# Patient Record
Sex: Female | Born: 1980 | Race: White | Hispanic: No | Marital: Married | Smoking: Former smoker
Health system: Southern US, Community
[De-identification: ages and names within clinical notes are randomized; demographics above are authoritative.]

## PROBLEM LIST (undated history)

## (undated) DIAGNOSIS — I10 Essential (primary) hypertension: Secondary | ICD-10-CM

## (undated) DIAGNOSIS — D649 Anemia, unspecified: Secondary | ICD-10-CM

## (undated) DIAGNOSIS — K219 Gastro-esophageal reflux disease without esophagitis: Secondary | ICD-10-CM

## (undated) DIAGNOSIS — R51 Headache: Secondary | ICD-10-CM

## (undated) DIAGNOSIS — J189 Pneumonia, unspecified organism: Secondary | ICD-10-CM

## (undated) DIAGNOSIS — R519 Headache, unspecified: Secondary | ICD-10-CM

## (undated) HISTORY — DX: Essential (primary) hypertension: I10

## (undated) HISTORY — PX: WISDOM TOOTH EXTRACTION: SHX21

---

## 2014-07-03 ENCOUNTER — Other Ambulatory Visit (INDEPENDENT_AMBULATORY_CARE_PROVIDER_SITE_OTHER): Payer: Self-pay | Admitting: General Surgery

## 2014-07-03 DIAGNOSIS — Z6841 Body Mass Index (BMI) 40.0 and over, adult: Secondary | ICD-10-CM

## 2014-07-03 DIAGNOSIS — I10 Essential (primary) hypertension: Secondary | ICD-10-CM

## 2014-07-03 DIAGNOSIS — K219 Gastro-esophageal reflux disease without esophagitis: Secondary | ICD-10-CM

## 2014-07-15 ENCOUNTER — Other Ambulatory Visit: Payer: Self-pay | Admitting: Family Medicine

## 2014-07-15 ENCOUNTER — Other Ambulatory Visit (HOSPITAL_COMMUNITY)
Admission: RE | Admit: 2014-07-15 | Discharge: 2014-07-15 | Disposition: A | Discharge status: Home / Self Care | Payer: Managed Care, Other (non HMO) | Source: Ambulatory Visit | Attending: Family Medicine | Admitting: Family Medicine

## 2014-07-15 DIAGNOSIS — Z01419 Encounter for gynecological examination (general) (routine) without abnormal findings: Secondary | ICD-10-CM | POA: Diagnosis present

## 2014-07-15 DIAGNOSIS — Z1151 Encounter for screening for human papillomavirus (HPV): Secondary | ICD-10-CM | POA: Diagnosis present

## 2014-07-18 LAB — CYTOLOGY - PAP

## 2014-07-25 ENCOUNTER — Ambulatory Visit (HOSPITAL_COMMUNITY)
Admission: RE | Admit: 2014-07-25 | Discharge: 2014-07-25 | Disposition: A | Discharge status: Home / Self Care | Payer: Managed Care, Other (non HMO) | Source: Ambulatory Visit | Attending: General Surgery | Admitting: General Surgery

## 2014-07-25 ENCOUNTER — Other Ambulatory Visit: Payer: Self-pay

## 2014-07-25 DIAGNOSIS — K219 Gastro-esophageal reflux disease without esophagitis: Secondary | ICD-10-CM | POA: Diagnosis not present

## 2014-07-25 DIAGNOSIS — Z01818 Encounter for other preprocedural examination: Secondary | ICD-10-CM | POA: Insufficient documentation

## 2014-08-04 ENCOUNTER — Encounter: Payer: Self-pay | Admitting: Dietician

## 2014-08-04 ENCOUNTER — Encounter: Payer: Managed Care, Other (non HMO) | Attending: General Surgery | Admitting: Dietician

## 2014-08-04 DIAGNOSIS — Z6841 Body Mass Index (BMI) 40.0 and over, adult: Secondary | ICD-10-CM | POA: Diagnosis not present

## 2014-08-04 DIAGNOSIS — Z713 Dietary counseling and surveillance: Secondary | ICD-10-CM | POA: Insufficient documentation

## 2014-08-04 NOTE — Patient Instructions (Signed)
Follow Pre-Op Goals Try Protein Shakes Return for 3 additional supervised weight loss visit [facility] at [phone] when surgery is scheduled to enroll in Pre-Op Class  Things to remember:  Please always be honest with us. We want to support you!  If you have any questions or concerns in between appointments, please call or email Jennette BankerLiz, Leslie, or Jacki ConesLaurie.  The diet after surgery will be high protein and low in carbohydrate.  Vitamins and calcium need to be taken for the rest of your life.  Feel free to include support people in any classes or appointments.

## 2014-08-04 NOTE — Progress Notes (Signed)
  Pre-Op Assessment Visit:  Pre-Operative RYGB Surgery  Medical Nutrition Therapy:  Appt start time: 0915  End time:  0945.  Patient was seen on 08/04/2014 for Pre-Operative Nutrition Assessment. Assessment and [facility] coordinator once plan for treating binge eating is established.   Preferred Learning Style:   No preference indicated   Learning Readiness:   Ready  Handouts given during visit include:  Pre-Op Goals Bariatric Surgery Protein Shakes   During the appointment today the following Pre-Op Goals were reviewed with the patient: Maintain or lose weight as instructed by your surgeon Make healthy food choices Begin to limit portion sizes Limited concentrated sugars and fried foods Keep fat/sugar in the single digits per serving on   food labels Practice CHEWING your food  (aim for 30 chews per bite or until applesauce consistency) Practice not drinking 15 minutes before, during, and 30 minutes after each meal/snack Avoid all carbonated beverages  Avoid/limit caffeinated beverages  Avoid all sugar-sweetened beverages Consume 3 meals per day; eat every 3-5 hours Make a list of non-food related activities Aim for 64-100 ounces of FLUID daily  Aim for at least 60-80 grams of PROTEIN daily Look for a liquid protein source that contain ?15 g protein and ?5 g carbohydrate  (ex: shakes, drinks, shots)  Patient-Centered Goals: Kylie Mitchell would like get healthier and decrease blood pressure. She would also like to enjoy her life more. She is very confident that she will be able to follow the pre op goals and thinks they are very important to follow.   Demonstrated degree of understanding via:  Teach Back  Teaching Method Utilized:  Visual Auditory Hands on  Barriers to learning/adherence to lifestyle change: depression  [facility] to [facility] the [facility] to enroll in Pre-Op and  Post-Op Nutrition Education when surgery date is scheduled.

## 2014-08-25 ENCOUNTER — Encounter: Payer: Managed Care, Other (non HMO) | Attending: General Surgery | Admitting: Dietician

## 2014-08-25 DIAGNOSIS — Z713 Dietary counseling and surveillance: Secondary | ICD-10-CM | POA: Diagnosis not present

## 2014-08-25 DIAGNOSIS — Z6841 Body Mass Index (BMI) 40.0 and over, adult: Secondary | ICD-10-CM | POA: Insufficient documentation

## 2014-08-25 NOTE — Patient Instructions (Signed)
Continue working on C.H. Robinson WorldwidePre-Op goals. (chewing) Increase walking to 30 minutes per day. Continue following up with Dr. Cyndia SkeetersLurey.

## 2014-08-25 NOTE — Progress Notes (Signed)
3 Months Supervised Weight Loss Visit:   Pre-Operative RYGB Surgery  Medical Nutrition Therapy:  Appt start time: 1130 end time:  1145.  Primary concerns today: Supervised Weight Loss Visit. Has seen Dr. Cyndia SkeetersLurey since previous visit and will be seeing him several more times. Still having no sugar, no soda and no bread and having Premier Protein shakes. Thinking about having the LAGB instead of the RYGB since she would like to not have a permanent procedure. Lost 1 lbs since last visit and only drinking water and crystal light. Working on portion control and chewing foods well. Walking about 20 minutes per day and would like to increase it to 30 minutes per day. Also would like to start swimming when weather gets warmer.  Wt Readings from Last 3 Encounters:  08/25/14 458 lb (207.747 kg)  08/04/14 459 lb 12.8 oz (208.564 kg)   Ht Readings from Last 3 Encounters:  08/25/14 5' 7.5" (1.715 m)  08/04/14 5' 7.5" (1.715 m)   Body mass index is 70.63 kg/(m^2). @BMIFA @ Normalized weight-for-age data available only for age 72 to 20 years. Normalized stature-for-age data available only for age 72 to 20 years.   Patient-Centered Goals: Kylie Mitchell would like get healthier and decrease blood pressure. She would also like to enjoy her life more. She is very confident that she will be able to follow the pre op goals and thinks they are very important to follow.   Preferred Learning Style:   No preference indicated   Learning Readiness:   Ready  Recent physical activity:  Walking 20 minutes each day  Progress Towards Goal(s):  In progress.  Nutritional Diagnosis:  [name]-3.3 Obesity related to past poor dietary habits and physical inactivity as evidenced by patient attending supervised weight loss for insurance approval of bariatric surgery.    Intervention:  Nutrition counseling provided. Plan: Continue working on C.H. Robinson WorldwidePre-Op goals. (chewing) Increase walking to 30 minutes per day. Continue following up  with Dr. Cyndia SkeetersLurey.  Teaching Method Utilized:  Visual Auditory Hands on  Barriers to learning/adherence to lifestyle change: emotional eating  Demonstrated degree of understanding via:  Teach Back   Monitoring/Evaluation:  Dietary intake, exercise, and body weight. Follow up in 1 months for 3 month supervised weight loss visit.

## 2014-09-08 ENCOUNTER — Other Ambulatory Visit: Payer: Self-pay | Admitting: Obstetrics and Gynecology

## 2014-09-08 DIAGNOSIS — N921 Excessive and frequent menstruation with irregular cycle: Secondary | ICD-10-CM

## 2014-09-15 ENCOUNTER — Ambulatory Visit
Admission: RE | Admit: 2014-09-15 | Discharge: 2014-09-15 | Disposition: A | Discharge status: Home / Self Care | Payer: Managed Care, Other (non HMO) | Source: Ambulatory Visit | Attending: Obstetrics and Gynecology | Admitting: Obstetrics and Gynecology

## 2014-09-15 ENCOUNTER — Other Ambulatory Visit: Payer: Managed Care, Other (non HMO)

## 2014-09-15 DIAGNOSIS — N921 Excessive and frequent menstruation with irregular cycle: Secondary | ICD-10-CM

## 2014-09-22 ENCOUNTER — Encounter: Payer: Managed Care, Other (non HMO) | Attending: General Surgery | Admitting: Dietician

## 2014-09-22 DIAGNOSIS — Z6841 Body Mass Index (BMI) 40.0 and over, adult: Secondary | ICD-10-CM | POA: Insufficient documentation

## 2014-09-22 DIAGNOSIS — Z713 Dietary counseling and surveillance: Secondary | ICD-10-CM | POA: Diagnosis not present

## 2014-09-22 NOTE — Progress Notes (Signed)
3 Months Supervised Weight Loss Visit:   Pre-Operative RYGB Surgery  Medical Nutrition Therapy:  Appt start time: 915 end time:  930.  Primary concerns today: Supervised Weight Loss Visit. Returns with a 3 lb weight loss. Increased walking to 30 minutes per day. Feeling really good already.   Wt Readings from Last 3 Encounters:  09/22/14 455 lb 1.6 oz (206.432 kg)  08/25/14 458 lb (207.747 kg)  08/04/14 459 lb 12.8 oz (208.564 kg)   Ht Readings from Last 3 Encounters:  09/22/14 5' 7.5" (1.715 m)  08/25/14 5' 7.5" (1.715 m)  08/04/14 5' 7.5" (1.715 m)   Body mass index is 70.19 kg/(m^2). @BMIFA @ Normalized weight-for-age data available only for age 69 to 20 years. Normalized stature-for-age data available only for age 69 to 20 years.    Patient-Centered Goals: Kylie Mitchell would like get healthier and decrease blood pressure. She would also like to enjoy her life more. She is very confident that she will be able to follow the pre op goals and thinks they are very important to follow.   Preferred Learning Style:   No preference indicated   Learning Readiness:   Ready  Recent physical activity:  Walking 30 minutes each day  Progress Towards Goal(s):  In progress.  Nutritional Diagnosis:  Kylie Mitchell-3.3 Obesity related to past poor dietary habits and physical inactivity as evidenced by patient attending supervised weight loss for insurance approval of bariatric surgery.    Intervention:  Nutrition counseling provided. Plan: Continue working on C.H. Robinson WorldwidePre-Op goals. (chewing) Start getting into more of routine.  Continue walking to 30 minutes per day. Look into swimming.  Continue following up with Dr. Cyndia SkeetersLurey.  Teaching Method Utilized:  Visual Auditory Hands on  Barriers to learning/adherence to lifestyle change: emotional eating  Demonstrated degree of understanding via:  Teach Back   Monitoring/Evaluation:  Dietary intake, exercise, and body weight. Follow up in 1 months for 3 month  supervised weight loss visit.

## 2014-09-22 NOTE — Patient Instructions (Signed)
Continue working on C.H. Robinson WorldwidePre-Op goals. (chewing) Start getting into more of routine.  Continue walking to 30 minutes per day. Look into swimming.  Continue following up with Dr. Cyndia SkeetersLurey.

## 2014-09-30 ENCOUNTER — Ambulatory Visit (INDEPENDENT_AMBULATORY_CARE_PROVIDER_SITE_OTHER): Payer: Managed Care, Other (non HMO) | Admitting: Psychiatry

## 2014-10-16 ENCOUNTER — Ambulatory Visit (INDEPENDENT_AMBULATORY_CARE_PROVIDER_SITE_OTHER): Payer: Managed Care, Other (non HMO) | Admitting: Psychiatry

## 2014-10-20 ENCOUNTER — Encounter: Payer: Self-pay | Admitting: Dietician

## 2014-10-20 ENCOUNTER — Encounter: Payer: Managed Care, Other (non HMO) | Attending: General Surgery | Admitting: Dietician

## 2014-10-20 DIAGNOSIS — Z713 Dietary counseling and surveillance: Secondary | ICD-10-CM | POA: Insufficient documentation

## 2014-10-20 DIAGNOSIS — Z6841 Body Mass Index (BMI) 40.0 and over, adult: Secondary | ICD-10-CM | POA: Diagnosis not present

## 2014-10-20 NOTE — Progress Notes (Signed)
3 Months Supervised Weight Loss Visit:   Pre-Operative RYGB Surgery  Medical Nutrition Therapy:  Appt start time: 935 end time:  950.  Primary concerns today: Supervised Weight Loss Visit #3. Returns with a 7 lb weight gain. Started taking provera this past month. Has had some issues with acid reflux. Increased walking to 30 minutes per day. Has been sticking with the diet to follow for the most part. Has a sweet tooth and trying to figure out what to have. Having crystal light lemonade.   Wt Readings from Last 3 Encounters:  10/20/14 462 lb 8 oz (209.789 kg)  09/22/14 455 lb 1.6 oz (206.432 kg)  08/25/14 458 lb (207.747 kg)   Ht Readings from Last 3 Encounters:  10/20/14 5' 7.5" (1.715 m)  09/22/14 5' 7.5" (1.715 m)  08/25/14 5' 7.5" (1.715 m)   Body mass index is 71.33 kg/(m^2). @BMIFA @ Normalized weight-for-age data available only for age 69 to 20 years. Normalized stature-for-age data available only for age 69 to 20 years.   Patient-Centered Goals: Jennette KettleDeanna would like get healthier and decrease blood pressure. She would also like to enjoy her life more. She is very confident that she will be able to follow the pre op goals and thinks they are very important to follow.   Preferred Learning Style:   No preference indicated   Learning Readiness:   Ready  Recent physical activity:  Walking 30 minutes most days  Progress Towards Goal(s):  In progress.  Nutritional Diagnosis:  [place]-3.3 Obesity related to past poor dietary habits and physical inactivity as evidenced by patient attending supervised weight loss for insurance approval of bariatric surgery.    Intervention:  Nutrition counseling provided. Plan: Continue working on C.H. Robinson WorldwidePre-Op goals. (chewing and eating slower) Continue getting into more of routine - regular meal times.  Continue walking to 30 minutes per day. [facility] 2-3 x week.  Continue following up with Dr. Noe GensPeters.  Have fruit if there are  cookies at the office or bring your own snacks instead of going to the vending machine. Try using a smaller plate to help with portion sizes. Try to eat meals without distractions.  [facility] at [phone] when surgery is scheduled to enroll in Pre-Op Class  Teaching Method Utilized:  Visual Auditory Hands on  Barriers to learning/adherence to lifestyle change: emotional eating  Demonstrated degree of understanding via:  Teach Back   Monitoring/Evaluation:  Dietary intake, exercise, and body weight. Follow up to attend Pre-Op Class

## 2014-10-20 NOTE — Patient Instructions (Addendum)
Continue working on C.H. Robinson WorldwidePre-Op goals. (chewing and eating slower) Continue getting into more of routine - regular meal times.  Continue walking to 30 minutes per day. [facility] 2-3 x week.  Continue following up with Dr. Noe GensPeters.  Have fruit if there are cookies at the office or bring your own snacks instead of going to the vending machine. Try using a smaller plate to help with portion sizes. Try to eat meals without distractions.  [facility] at [phone] when surgery is scheduled to enroll in Pre-Op Class

## 2014-10-28 ENCOUNTER — Other Ambulatory Visit: Payer: Self-pay | Admitting: General Surgery

## 2014-11-12 ENCOUNTER — Other Ambulatory Visit: Payer: Self-pay | Admitting: General Surgery

## 2014-12-04 ENCOUNTER — Ambulatory Visit
Admission: RE | Admit: 2014-12-04 | Discharge: 2014-12-04 | Disposition: A | Discharge status: Home / Self Care | Payer: Managed Care, Other (non HMO) | Source: Ambulatory Visit | Attending: General Surgery | Admitting: General Surgery

## 2015-01-19 ENCOUNTER — Encounter: Payer: Managed Care, Other (non HMO) | Attending: General Surgery

## 2015-01-19 DIAGNOSIS — Z713 Dietary counseling and surveillance: Secondary | ICD-10-CM | POA: Diagnosis not present

## 2015-01-19 DIAGNOSIS — Z6841 Body Mass Index (BMI) 40.0 and over, adult: Secondary | ICD-10-CM | POA: Diagnosis not present

## 2015-01-19 NOTE — Progress Notes (Signed)
  Pre-Operative Nutrition Class:  Appt start time: 830   End time:  930.  Patient was seen on 01/19/15 for Pre-Operative Bariatric Surgery Education at [facility].   Surgery date:  Surgery type: RYGB [facility] at [facility]: 459.5 lbs on 08/04/14 Weight today: 454 lbs  TANITA  BODY COMP RESULTS  01/19/15   BMI (kg/m^2) 70.1   Fat Mass (lbs) 250   Fat Free Mass (lbs) 204   Total Body Water (lbs) 149.5   Samples given per MNT protocol. Patient educated on appropriate usage: Premier protein shake (vanilla - qty 1) Lot #: [id number] Exp: 06/2015  Celebrate calcium chew (berry - qty 1) Lot #: [id number] Exp: 08/2016  Renee Pain Protein Powder (unflavored - qty 1) Lot #: [id number] Exp: 12/2015  The following the learning objectives were met by the patient during this course:  Identify Pre-Op Dietary Goals and will begin 2 weeks pre-operatively  Identify appropriate sources of fluids and proteins   State protein recommendations and appropriate sources pre and post-operatively  Identify Post-Operative Dietary Goals and will follow for 2 weeks post-operatively  Identify appropriate multivitamin and calcium sources  Describe the need for physical activity post-operatively and will follow MD recommendations  State when to call healthcare provider regarding medication questions or post-operative complications  Handouts given during class include:  Pre-Op Bariatric Surgery Diet Handout  Protein Shake Handout  Post-Op Bariatric Surgery Nutrition Handout  BELT Program Information Flyer  Support Group Information Flyer  [facility] Bariatric Supplements Price List  Follow-Up Plan: Patient will follow-up at [facility] 2 weeks post operatively for diet advancement per MD.

## 2015-01-29 NOTE — Progress Notes (Signed)
Cardiology Office Note   Date:  01/30/2015   ID:  Kylie Mitchell, DOB 06-10-1980, MRN [id number]  PCP:  Jarrett Soho, PA-C  Cardiologist:   Madilyn Hook, MD   Chief Complaint  Patient presents with  . Advice Only    NP- SURGICAL CLEARANCE  bariatric surgery scheduled 03/02/15      History of Present Illness: Kylie Mitchell is a 34 y.o. female with hypertension and  morbid obesity who presents for presurgical evaluation. Kylie Mitchell recently joined a gym and has been exercising at the pool almost daily for the last 5 weeks.  She swims for 1-2 hours and participates in exercise classes.  Since doing this she has noticed increased energy, is feeling better and sleeping better.  She denies any chest pain or pressure with these activities.  She has lost 26lb since January.  She report occasional dizziness when she first starts exercising.  This goes away within seconds and she has not passed out.  She also notices lightheadedness when going from sitting to standing.  Kylie Mitchell endorses LE edema x 6 years that improves with elevation.  She denies orthopnea.  She has exertional dyspnea that she associates with being deconditioned and overweight. However she is able to walk up a flight of stairs or walk several blocks without stopping.  She does not get any CP with these activities.  She does all her ADLs independently.   Kylie Mitchell is on her weight loss journey with her husband, who is scheduled for bariatric surgery on Monday.   Past Medical History  Diagnosis Date  . Hypertension     Past Surgical History  Procedure Laterality Date  . Cesarean section       Current Outpatient Prescriptions  Medication Sig Dispense Refill  . hydrochlorothiazide (HYDRODIURIL) 25 MG tablet Take 25 mg by mouth daily.    Marland Kitchen losartan (COZAAR) 50 MG tablet Take 50 mg by mouth daily.  1  . medroxyPROGESTERone (PROVERA) 10 MG tablet Take 10 mg by mouth daily.    .  ranitidine (ZANTAC) 150 MG capsule Take 150 mg by mouth 2 (two) times daily.     No current facility-administered medications for this visit.    Allergies:   Morphine and related and Penicillins    Social History:  The patient  reports that she has quit smoking. Her smoking use included Cigarettes. She has a 30 pack-year smoking history. She has never used smokeless tobacco. She reports that she drinks alcohol.   Family History:  The patient's family history includes Hypertension in her maternal aunt, maternal uncle, and mother.    ROS:  Please see the history of present illness.   Otherwise, review of systems are positive for none.   All other systems are reviewed and negative.    PHYSICAL EXAM: VS:  BP 128/92 mmHg  Pulse 79  Ht 5\' 8"  (1.727 m)  Wt 203.438 kg (448 lb 8 oz)  BMI 68.21 kg/m2 , BMI Body mass index is 68.21 kg/(m^2). GENERAL:  Well appearing.  Morbidly obese. HEENT:  Pupils equal round and reactive, fundi not visualized, oral mucosa unremarkable NECK:  No jugular venous distention, waveform within normal limits, carotid upstroke brisk and symmetric, no bruits, no thyromegaly LYMPHATICS:  No cervical adenopathy LUNGS:  Clear to auscultation bilaterally HEART:  RRR.  PMI not displaced or sustained,S1 and S2 within normal limits, no S3, no S4, no clicks, no rubs, no murmurs ABD:  Flat, positive bowel sounds  normal in frequency in pitch, no bruits, no rebound, no guarding, no midline pulsatile mass, no hepatomegaly, no splenomegaly EXT:  2 plus pulses throughout, no edema, no cyanosis no clubbing SKIN:  No rashes no nodules NEURO:  Cranial nerves II through XII grossly intact, motor grossly intact throughout PSYCH:  Cognitively intact, oriented to person place and time    EKG:  EKG is ordered today. The ekg ordered today demonstrates sinus rhythm 79 bpm.  Low voltage precordial leads.   Recent Labs: No results found for requested labs within last 365 days.     Lipid Panel No results found for: CHOL, TRIG, HDL, CHOLHDL, VLDL, LDLCALC, LDLDIRECT    Wt Readings from Last 3 Encounters:  01/30/15 203.438 kg (448 lb 8 oz)  01/19/15 205.933 kg (454 lb)  10/20/14 209.789 kg (462 lb 8 oz)      Other studies Reviewed: Additional studies/ records that were reviewed today include: nutritionist notes. Review of the above records demonstrates:  Please see elsewhere in the note.     ASSESSMENT AND PLAN:  # Pre-surgical assessment:  Kylie Mitchell is able to perform her ADLs and more than 4 METs of activity without chest pain.  Although she is morbidly obese, he is quite young, making a cardiac event less likely.  The patient does not have any unstable cardiac conditions.  Upon evaluation today, she can achieve 4 METs or greater without anginal symptoms.  According to [facility] and AHA guidelines, she requires no further cardiac workup prior to her noncardiac surgery and should be at acceptable risk.  Our service is available as necessary in the perioperative period.  We will obtain a BMP today so that we can perform a formal NSQIP risk assessment profile. - BMP to complete surgical risk assessment - If creatinine is <1.5, will proceed with surgery without additional testing.  # Hypertension: BP nearly at goal.  No changes to current regimen of losartan and HCTZ.   Current medicines are reviewed at length with the patient today.  The patient does not have concerns regarding medicines.  The following changes have been made:  no change  Labs/ tests ordered today include: BMP  Orders Placed This Encounter  Procedures  . Basic metabolic panel  . EKG 12-Lead     Disposition:   FU with Dr. Elmarie Shiley C. Duke Salvia prn   Signed, Madilyn Hook, MD  01/30/2015 8:35 AM    [facility]

## 2015-01-30 ENCOUNTER — Encounter: Payer: Self-pay | Admitting: Cardiovascular Disease

## 2015-01-30 ENCOUNTER — Ambulatory Visit (INDEPENDENT_AMBULATORY_CARE_PROVIDER_SITE_OTHER): Payer: Managed Care, Other (non HMO) | Admitting: Cardiovascular Disease

## 2015-01-30 VITALS — BP 128/92 | HR 79 | Ht 68.0 in | Wt >= 6400 oz

## 2015-01-30 DIAGNOSIS — Z01810 Encounter for preprocedural cardiovascular examination: Secondary | ICD-10-CM

## 2015-01-30 LAB — BASIC METABOLIC PANEL
BUN: 11 mg/dL (ref 7–25)
CO2: 31 mmol/L (ref 20–31)
Calcium: 9.4 mg/dL (ref 8.6–10.2)
Chloride: 100 mmol/L (ref 98–110)
Creat: 0.7 mg/dL (ref 0.50–1.10)
GLUCOSE: 97 mg/dL (ref 65–99)
Potassium: 3.8 mmol/L (ref 3.5–5.3)
SODIUM: 139 mmol/L (ref 135–146)

## 2015-01-30 NOTE — Patient Instructions (Signed)
Your physician recommends that you return for lab work today. If your results are within normal limits you will be given clearance to have your surgery.  Your physician recommends that you schedule a follow-up appointment as needed.

## 2015-02-02 ENCOUNTER — Telehealth: Payer: Self-pay | Admitting: *Deleted

## 2015-02-02 NOTE — Telephone Encounter (Signed)
-----   Message from Chilton Si, MD sent at 02/02/2015  1:24 PM EDT ----- Labs are normal.  Ms. Kylie Mitchell is OK to go forward with surgery without further testing.

## 2015-02-02 NOTE — Telephone Encounter (Signed)
LEFT DETAIL MESSAGE SECURE VOICE MAIL. ROUTED INFORMATION TO Dr Minerva Areola Andrey Campanile

## 2015-02-11 ENCOUNTER — Ambulatory Visit: Payer: Self-pay | Admitting: General Surgery

## 2015-02-11 NOTE — H&P (Signed)
Kylie Mitchell 02/11/2015 11:36 AM Location: [facility] Patient #: [id number] DOB: May 15, 1981 Married / Language: Kylie Mitchell / Race: White Female History of Present Illness Randall Hiss M. Alyana Kreiter MD; 02/11/2015 12:19 PM) Patient words: bariatric.  The patient is a 34 year old female who presents for a preoperative evaluation. She has been approved for laparoscopic Roux-en-Y gastric bypass. I initially met her on January 28. Her weight at that time was 473 pounds. She denies any medical changes since she was initially seen. She did quit smoking in March. She recently started swimming on a regular basis and has lost about 35 pounds. She is swimming 5 times a week as well as doing water aerobics. She did see a cardiologist for clearance and was cleared for surgery. She denies any abdominal pain, nausea, vomiting, chest pain, chest pressure, shortness of breath. She states her knee pain has improved since she has been exercising.  Her upper GI was within normal limits. Her chest x-ray and EKG were normal. Her initial evaluation labs were unremarkable except for hemoglobin A1c of 6.6, HDL level 41 LDL level 128, iron at 37  a 12 point comprehensive ROS was performed and negative except for HPI Problem List/Past Medical Gayland Curry, MD; 02/11/2015 12:18 PM) GASTROESOPHAGEAL REFLUX DISEASE, ESOPHAGITIS PRESENCE NOT SPECIFIED (K21.9) ESSENTIAL HYPERTENSION (I10) CHRONIC PAIN OF BOTH KNEES (M25.561, M25.562) MORBID OBESITY WITH BMI OF 60.0-69.9, ADULT (E66.01) PREDIABETES (R73.09)  Other Problems Gayland Curry, MD; 02/11/2015 12:18 PM) Migraine Headache Anxiety Disorder Back Pain Depression  Past Surgical History Gayland Curry, MD; 02/11/2015 12:18 PM) Cesarean Section - 1  Diagnostic Studies History Gayland Curry, MD; 02/11/2015 12:18 PM) Colonoscopy never Mammogram never Pap Smear >5 years ago  Allergies ([place], [place]; 02/11/2015 11:36  AM) Penicillins Morphine Derivatives No Known Drug Allergies 07/03/2014 (Marked as Inactive)  Medication History Gayland Curry, MD; 02/11/2015 12:18 PM) Hydrochlorothiazide (25MG Tablet, Oral daily) Active. Losartan Potassium (50MG Tablet, Oral) Active. Ranitidine HCl (150MG Capsule, Oral) Active. Medications Reconciled Zofran ODT (4MG Tablet Disperse, 1 (one) Tablet Disperse Oral every six hours, as needed, Taken starting 02/11/2015) Active. OxyCODONE HCl (5MG/5ML Solution, 5-10 Milliliter Oral every four hours, as needed, Taken starting 02/11/2015) Active. Cyclobenzaprine HCl (10MG Tablet, Oral as needed) Active. Naproxen (500MG Tablet, Oral as needed) Active. Osteo Bi-Flex Adv Double St (Oral daily) Active. Potassium Chloride ER (10MEQ Capsule ER, Oral daily) Active. Magnesium Gluconate (250MG Tablet, Oral daily) Active. Ocuvite (dai Oral) Active.  Social History Gayland Curry, MD; 02/11/2015 12:18 PM) Caffeine use Coffee. Tobacco use Current some day smoker. Alcohol use Occasional alcohol use. Illicit drug use Remotely quit drug use.  Family History Gayland Curry, MD; 02/11/2015 12:18 PM) Arthritis Father. Heart disease in female family member before age 1 Hypertension Mother.  Pregnancy / Birth History Gayland Curry, MD; 02/11/2015 12:18 PM) Maternal age 57-20 Para 17 Age at menarche 57 years. Contraceptive History Depo-provera, Oral contraceptives. Irregular periods    Vitals (Sonya Bynum CMA; 02/11/2015 11:36 AM) 02/11/2015 11:36 AM Weight: 438.6 lb Height: 67.5in Body Surface Area: 3.08 m Body Mass Index: 67.68 kg/m Temp.: 57F(Temporal)  Pulse: 79 (Regular)  BP: 124/76 (Sitting, Left Arm, Standard)     Physical Exam Randall Hiss M. Jennavieve Arrick MD; 02/11/2015 12:11 PM)  The physical exam findings are as follows: Note:MORBIDLY OBESE, evenly distributed  General Mental Status-Alert. General Appearance-Consistent with stated  age. Hydration-Well hydrated. Voice-Normal.  Head and Neck Head-normocephalic, atraumatic with no lesions or palpable masses. Trachea-midline. Thyroid  Gland Characteristics - normal size and consistency.  Eye Eyeball - Bilateral-Extraocular movements intact. Sclera/Conjunctiva - Bilateral-No scleral icterus.  Chest and Lung Exam Chest and lung exam reveals -quiet, even and easy respiratory effort with no use of accessory muscles and on auscultation, normal breath sounds, no adventitious sounds and normal vocal resonance. Inspection Chest Wall - Normal. Back - normal.  Breast - Did not examine.  Cardiovascular Cardiovascular examination reveals -normal heart sounds, regular rate and rhythm with no murmurs and normal pedal pulses bilaterally.  Abdomen Inspection Inspection of the abdomen reveals - No Hernias. Skin - Scar - Note: Well-healed Pfannenstiel incision. Palpation/Percussion Palpation and Percussion of the abdomen reveal - Soft, Non Tender, No Rebound tenderness, No Rigidity (guarding) and No hepatosplenomegaly. Auscultation Auscultation of the abdomen reveals - Bowel sounds normal.  Peripheral Vascular Upper Extremity Palpation - Pulses bilaterally normal.  Neurologic Neurologic evaluation reveals -alert and oriented x 3 with no impairment of recent or remote memory. Mental Status-Normal.  Neuropsychiatric The patient's mood and affect are described as -normal. Judgment and Insight-insight is appropriate concerning matters relevant to self.  Musculoskeletal Normal Exam - Left-Upper Extremity Strength Normal and Lower Extremity Strength Normal. Normal Exam - Right-Upper Extremity Strength Normal and Lower Extremity Strength Normal. Note: some crepitus in b/l knees   Lymphatic Head & Neck  General Head & Neck Lymphatics: Bilateral - Description - Normal. Axillary - Did not examine. Femoral & Inguinal - Did not  examine.    Assessment & Plan Randall Hiss M. Lylianna Fraiser MD; 02/11/2015 12:17 PM)  MORBID OBESITY WITH BMI OF 60.0-69.9, ADULT (E66.01) Impression: We reviewed her preoperative workup. We discussed her labs and the finding of her hemoglobin A1c. I imagine her hemoglobin A1c is improved slightly since she has lost about 35 pounds. I congratulated her on her weight loss. I highly encouraged her to continue with this postoperatively. All of her questions were asked and answered. Her husband recently had surgery as well so she is very familiar with the postop protocol. She was given prescriptions for postoperative pain and nausea medication today. She was given instructions for her bowel prep. I encouraged her to continue with the preoperative diet and swimming  Current Plans Pt Education - EMW_preopbariatric Started Zofran ODT 4MG, 1 (one) Tablet Disperse every six hours, as needed, #25, 02/11/2015, No Refill. Started OxyCODONE HCl 5MG/5ML, 5-10 Milliliter every four hours, as needed, 200 Milliliter, 02/11/2015, No Refill. CHRONIC PAIN OF BOTH KNEES (M25.561) Impression: Managed by PCP  ESSENTIAL HYPERTENSION (I10) Impression: Managed by PCP; cardiology cleared pt  GASTROESOPHAGEAL REFLUX DISEASE, ESOPHAGITIS PRESENCE NOT SPECIFIED (K21.9) Impression: managed by PCP  PREDIABETES (R73.09) Impression: will monitor over time  Leighton Ruff. Redmond Pulling, MD, FACS General, Bariatric, & Minimally Invasive Surgery [facility], [place]

## 2015-02-20 NOTE — Patient Instructions (Signed)
Kylie Mitchell  02/20/2015   Your procedure is scheduled on: March 02, 2015  Report to [facility] Main  Entrance take [facility]  elevators to 3rd floor to  Short Stay Center at  8:15 AM.  Call this number if you have problems the morning of surgery [phone]   Remember: ONLY 1 PERSON MAY GO WITH YOU TO SHORT STAY TO GET  READY MORNING OF YOUR SURGERY.  Do not eat food or drink liquids :After Midnight.              Bowel prep as instructed.     Take these medicines the morning of surgery with A SIP OF WATER:  None                               You may not have any metal on your body including hair pins and              piercings  Do not wear jewelry, make-up, lotions, powders or perfumes, deodorant             Do not wear nail polish.  Do not shave  48 hours prior to surgery.              Do not bring valuables to the hospital. [facility] IS NOT             RESPONSIBLE   FOR VALUABLES.  Contacts, dentures or bridgework may not be worn into surgery.  Leave suitcase in the car. After surgery it may be brought to your room.       Special Instructions: coughing and deep breathing exercises, leg exercises              Please read over the following fact sheets you were given: _____________________________________________________________________             [facility] - Preparing for Surgery Before surgery, you can play an important role.  Because skin is not sterile, your skin needs to be as free of germs as possible.  You can reduce the number of germs on your skin by washing with CHG (chlorahexidine gluconate) soap before surgery.  CHG is an antiseptic cleaner which kills germs and bonds with the skin to continue killing germs even after washing. Please DO NOT use if you have an allergy to CHG or antibacterial soaps.  If your skin becomes reddened/irritated stop using the CHG and inform your nurse when you arrive at Short Stay. Do not shave  (including legs and underarms) for at least 48 hours prior to the first CHG shower.  You may shave your face/neck. Please follow these instructions carefully:  1.  Shower with CHG Soap the night before surgery and the  morning of Surgery.  2.  If you choose to wash your hair, wash your hair first as usual with your  normal  shampoo.  3.  After you shampoo, rinse your hair and body thoroughly to remove the  shampoo.                           4.  Use CHG as you would any other liquid soap.  You can apply chg directly  to the skin and wash  Gently with a scrungie or clean washcloth.  5.  Apply the CHG Soap to your body ONLY FROM THE NECK DOWN.   Do not use on face/ open                           Wound or open sores. Avoid contact with eyes, ears mouth and genitals (private parts).                       Wash face,  Genitals (private parts) with your normal soap.             6.  Wash thoroughly, paying special attention to the area where your surgery  will be performed.  7.  Thoroughly rinse your body with warm water from the neck down.  8.  DO NOT shower/wash with your normal soap after using and rinsing off  the CHG Soap.                9.  Pat yourself dry with a clean towel.            10.  Wear clean pajamas.            11.  Place clean sheets on your bed the night of your first shower and do not  sleep with pets. Day of Surgery : Do not apply any lotions/deodorants the morning of surgery.  Please wear clean clothes to the hospital/surgery center.  FAILURE TO FOLLOW THESE INSTRUCTIONS MAY RESULT IN THE CANCELLATION OF YOUR SURGERY PATIENT SIGNATURE_________________________________  NURSE SIGNATURE__________________________________  ________________________________________________________________________

## 2015-02-26 ENCOUNTER — Encounter (HOSPITAL_COMMUNITY)
Admission: RE | Admit: 2015-02-26 | Discharge: 2015-02-26 | Disposition: A | Discharge status: Home / Self Care | Payer: Managed Care, Other (non HMO) | Source: Ambulatory Visit | Attending: General Surgery | Admitting: General Surgery

## 2015-02-26 ENCOUNTER — Encounter (HOSPITAL_COMMUNITY): Payer: Self-pay

## 2015-02-26 DIAGNOSIS — Z01818 Encounter for other preprocedural examination: Secondary | ICD-10-CM | POA: Diagnosis present

## 2015-02-26 HISTORY — DX: Gastro-esophageal reflux disease without esophagitis: K21.9

## 2015-02-26 HISTORY — DX: Headache: R51

## 2015-02-26 HISTORY — DX: Anemia, unspecified: D64.9

## 2015-02-26 HISTORY — DX: Headache, unspecified: R51.9

## 2015-02-26 HISTORY — DX: Pneumonia, unspecified organism: J18.9

## 2015-02-26 LAB — COMPREHENSIVE METABOLIC PANEL
ALT: 22 U/L (ref 14–54)
AST: 25 U/L (ref 15–41)
Albumin: 3.7 g/dL (ref 3.5–5.0)
Alkaline Phosphatase: 39 U/L (ref 38–126)
Anion gap: 9 (ref 5–15)
BUN: 9 mg/dL (ref 6–20)
CHLORIDE: 101 mmol/L (ref 101–111)
CO2: 27 mmol/L (ref 22–32)
CREATININE: 0.71 mg/dL (ref 0.44–1.00)
Calcium: 9.2 mg/dL (ref 8.9–10.3)
GFR calc Af Amer: >60 mL/min (ref >60)
GFR calc non Af Amer: >60 mL/min (ref >60)
GLUCOSE: 99 mg/dL (ref 65–99)
Potassium: 3.2 mmol/L — ABNORMAL LOW (ref 3.5–5.1)
SODIUM: 137 mmol/L (ref 135–145)
Total Bilirubin: 0.4 mg/dL (ref 0.3–1.2)
Total Protein: 7.2 g/dL (ref 6.5–8.1)

## 2015-02-26 LAB — CBC WITH DIFFERENTIAL/PLATELET
BASOS ABS: 0 10*3/uL (ref 0.0–0.1)
Basophils Relative: 1 %
EOS ABS: 0.2 10*3/uL (ref 0.0–0.7)
EOS PCT: 3 %
HCT: 39.7 % (ref 36.0–46.0)
Hemoglobin: 12.6 g/dL (ref 12.0–15.0)
LYMPHS PCT: 27 %
Lymphs Abs: 2.2 10*3/uL (ref 0.7–4.0)
MCH: 27.6 pg (ref 26.0–34.0)
MCHC: 31.7 g/dL (ref 30.0–36.0)
MCV: 87.1 fL (ref 78.0–100.0)
Monocytes Absolute: 0.6 10*3/uL (ref 0.1–1.0)
Monocytes Relative: 8 %
NEUTROS PCT: 61 %
Neutro Abs: 5 10*3/uL (ref 1.7–7.7)
PLATELETS: 335 10*3/uL (ref 150–400)
RBC: 4.56 MIL/uL (ref 3.87–5.11)
RDW: 13.6 % (ref 11.5–15.5)
WBC: 8.1 10*3/uL (ref 4.0–10.5)

## 2015-02-26 NOTE — Progress Notes (Signed)
01-30-15 - EKG - EPIC 01-30-15 - LOV - Dr. Duke Salvia (cardio) (clearance if s. Creatinine  Is <1.5) - EPIC 12-04-14 - UGI w/KUB - EPIC 07-25-14 - 2V CXR - EPIC

## 2015-02-26 NOTE — Progress Notes (Signed)
02-26-15 - talked to [facility] in [employer].  Advised pt. Will need a bari bed with trapeze after surgery on 03-02-15.  Order for bari bed put in [facility]

## 2015-02-26 NOTE — Progress Notes (Signed)
01-30-15 - LOV - Dr. Duke Salvia (cardio) - Surgery clearance if s. Creatinine is <5.  At preop appt. On 02-26-15 S. Creatinine was  .71  - EPIC

## 2015-03-01 MED ORDER — LEVOFLOXACIN IN D5W 750 MG/150ML IV SOLN
750.0000 mg | INTRAVENOUS | Status: AC
  Administered 2015-03-02: 750 mg via INTRAVENOUS
  Filled 2015-03-01: qty 150

## 2015-03-02 ENCOUNTER — Encounter (HOSPITAL_COMMUNITY): Payer: Self-pay | Admitting: *Deleted

## 2015-03-02 ENCOUNTER — Encounter (HOSPITAL_COMMUNITY)
Admission: RE | Disposition: A | Discharge status: Home / Self Care | Payer: Self-pay | Source: Ambulatory Visit | Attending: General Surgery

## 2015-03-02 ENCOUNTER — Inpatient Hospital Stay (HOSPITAL_COMMUNITY): Payer: Managed Care, Other (non HMO) | Admitting: Anesthesiology

## 2015-03-02 ENCOUNTER — Inpatient Hospital Stay (HOSPITAL_COMMUNITY)
Admission: RE | Admit: 2015-03-02 | Discharge: 2015-03-04 | DRG: 621 | Disposition: A | Discharge status: Home / Self Care | Payer: Managed Care, Other (non HMO) | Source: Ambulatory Visit | Attending: General Surgery | Admitting: General Surgery

## 2015-03-02 DIAGNOSIS — I10 Essential (primary) hypertension: Secondary | ICD-10-CM | POA: Diagnosis present

## 2015-03-02 DIAGNOSIS — K210 Gastro-esophageal reflux disease with esophagitis: Secondary | ICD-10-CM | POA: Diagnosis present

## 2015-03-02 DIAGNOSIS — Z6841 Body Mass Index (BMI) 40.0 and over, adult: Secondary | ICD-10-CM | POA: Diagnosis not present

## 2015-03-02 DIAGNOSIS — Z01812 Encounter for preprocedural laboratory examination: Secondary | ICD-10-CM | POA: Diagnosis not present

## 2015-03-02 DIAGNOSIS — F419 Anxiety disorder, unspecified: Secondary | ICD-10-CM | POA: Diagnosis present

## 2015-03-02 DIAGNOSIS — F17210 Nicotine dependence, cigarettes, uncomplicated: Secondary | ICD-10-CM | POA: Diagnosis present

## 2015-03-02 DIAGNOSIS — F329 Major depressive disorder, single episode, unspecified: Secondary | ICD-10-CM | POA: Diagnosis present

## 2015-03-02 DIAGNOSIS — Z79899 Other long term (current) drug therapy: Secondary | ICD-10-CM | POA: Diagnosis not present

## 2015-03-02 DIAGNOSIS — M549 Dorsalgia, unspecified: Secondary | ICD-10-CM | POA: Diagnosis present

## 2015-03-02 DIAGNOSIS — M170 Bilateral primary osteoarthritis of knee: Secondary | ICD-10-CM | POA: Diagnosis present

## 2015-03-02 DIAGNOSIS — R7303 Prediabetes: Secondary | ICD-10-CM | POA: Diagnosis present

## 2015-03-02 DIAGNOSIS — R7309 Other abnormal glucose: Secondary | ICD-10-CM | POA: Diagnosis present

## 2015-03-02 DIAGNOSIS — R110 Nausea: Secondary | ICD-10-CM

## 2015-03-02 DIAGNOSIS — Z9884 Bariatric surgery status: Secondary | ICD-10-CM

## 2015-03-02 DIAGNOSIS — K219 Gastro-esophageal reflux disease without esophagitis: Secondary | ICD-10-CM | POA: Diagnosis present

## 2015-03-02 DIAGNOSIS — Z8249 Family history of ischemic heart disease and other diseases of the circulatory system: Secondary | ICD-10-CM | POA: Diagnosis not present

## 2015-03-02 DIAGNOSIS — G8929 Other chronic pain: Secondary | ICD-10-CM | POA: Diagnosis present

## 2015-03-02 HISTORY — PX: GASTRIC ROUX-EN-Y: SHX5262

## 2015-03-02 HISTORY — PX: UPPER GI ENDOSCOPY: SHX6162

## 2015-03-02 LAB — HEMOGLOBIN AND HEMATOCRIT, BLOOD
HCT: 39.1 % (ref 36.0–46.0)
Hemoglobin: 12.6 g/dL (ref 12.0–15.0)

## 2015-03-02 LAB — PREGNANCY, URINE: PREG TEST UR: NEGATIVE

## 2015-03-02 SURGERY — LAPAROSCOPIC ROUX-EN-Y GASTRIC BYPASS WITH UPPER ENDOSCOPY
Anesthesia: General

## 2015-03-02 MED ORDER — DEXAMETHASONE SODIUM PHOSPHATE 4 MG/ML IJ SOLN
INTRAMUSCULAR | Status: DC | PRN
  Administered 2015-03-02: 10 mg via INTRAVENOUS

## 2015-03-02 MED ORDER — FENTANYL CITRATE (PF) 100 MCG/2ML IJ SOLN
INTRAMUSCULAR | Status: AC
  Filled 2015-03-02: qty 2

## 2015-03-02 MED ORDER — CETYLPYRIDINIUM CHLORIDE 0.05 % MT LIQD
7.0000 mL | Freq: Two times a day (BID) | OROMUCOSAL | Status: DC
  Administered 2015-03-02 – 2015-03-03 (×3): 7 mL via OROMUCOSAL

## 2015-03-02 MED ORDER — ENOXAPARIN SODIUM 30 MG/0.3ML ~~LOC~~ SOLN
30.0000 mg | Freq: Two times a day (BID) | SUBCUTANEOUS | Status: DC
  Administered 2015-03-03 – 2015-03-04 (×3): 30 mg via SUBCUTANEOUS
  Filled 2015-03-02 (×5): qty 0.3

## 2015-03-02 MED ORDER — LACTATED RINGERS IV SOLN
INTRAVENOUS | Status: DC | PRN
  Administered 2015-03-02 (×2): via INTRAVENOUS

## 2015-03-02 MED ORDER — ACETAMINOPHEN 160 MG/5ML PO SOLN: 325.0000 mg | ORAL | Status: DC | PRN

## 2015-03-02 MED ORDER — ONDANSETRON HCL 4 MG/2ML IJ SOLN
INTRAMUSCULAR | Status: DC | PRN
  Administered 2015-03-02: 4 mg via INTRAVENOUS

## 2015-03-02 MED ORDER — GLYCOPYRROLATE 0.2 MG/ML IJ SOLN
INTRAMUSCULAR | Status: AC
  Filled 2015-03-02: qty 3

## 2015-03-02 MED ORDER — FENTANYL CITRATE (PF) 100 MCG/2ML IJ SOLN
INTRAMUSCULAR | Status: DC | PRN
  Administered 2015-03-02 (×4): 50 ug via INTRAVENOUS
  Administered 2015-03-02: 25 ug via INTRAVENOUS
  Administered 2015-03-02: 50 ug via INTRAVENOUS
  Administered 2015-03-02: 25 ug via INTRAVENOUS
  Administered 2015-03-02 (×3): 50 ug via INTRAVENOUS

## 2015-03-02 MED ORDER — UNJURY VANILLA POWDER: 2.0000 [oz_av] | Freq: Four times a day (QID) | ORAL | Status: DC

## 2015-03-02 MED ORDER — HYDROMORPHONE HCL 1 MG/ML IJ SOLN
INTRAMUSCULAR | Status: AC
  Filled 2015-03-02: qty 1

## 2015-03-02 MED ORDER — EVICEL 5 ML EX KIT
PACK | CUTANEOUS | Status: DC | PRN
  Administered 2015-03-02: 1

## 2015-03-02 MED ORDER — CHLORHEXIDINE GLUCONATE 4 % EX LIQD: 60.0000 mL | Freq: Once | CUTANEOUS | Status: DC

## 2015-03-02 MED ORDER — ONDANSETRON HCL 4 MG/2ML IJ SOLN
4.0000 mg | INTRAMUSCULAR | Status: DC | PRN
  Administered 2015-03-02 – 2015-03-04 (×4): 4 mg via INTRAVENOUS
  Filled 2015-03-02 (×4): qty 2

## 2015-03-02 MED ORDER — SODIUM CHLORIDE 0.9 % IJ SOLN
INTRAMUSCULAR | Status: DC | PRN
  Administered 2015-03-02: 50 mL via INTRAVENOUS

## 2015-03-02 MED ORDER — PHENYLEPHRINE HCL 10 MG/ML IJ SOLN
INTRAMUSCULAR | Status: DC | PRN
  Administered 2015-03-02: 40 ug via INTRAVENOUS

## 2015-03-02 MED ORDER — FENTANYL CITRATE (PF) 100 MCG/2ML IJ SOLN
25.0000 ug | INTRAMUSCULAR | Status: DC | PRN
  Administered 2015-03-02 (×3): 25 ug via INTRAVENOUS
  Administered 2015-03-03 (×3): 75 ug via INTRAVENOUS
  Administered 2015-03-03: 50 ug via INTRAVENOUS
  Administered 2015-03-03: 75 ug via INTRAVENOUS
  Administered 2015-03-03: 50 ug via INTRAVENOUS
  Filled 2015-03-02 (×9): qty 2

## 2015-03-02 MED ORDER — PANTOPRAZOLE SODIUM 40 MG IV SOLR
40.0000 mg | Freq: Every day | INTRAVENOUS | Status: DC
  Administered 2015-03-02 – 2015-03-03 (×2): 40 mg via INTRAVENOUS
  Filled 2015-03-02 (×4): qty 40

## 2015-03-02 MED ORDER — HYDROMORPHONE HCL 1 MG/ML IJ SOLN
0.2500 mg | INTRAMUSCULAR | Status: DC | PRN
  Administered 2015-03-02 (×4): 0.5 mg via INTRAVENOUS

## 2015-03-02 MED ORDER — FENTANYL CITRATE (PF) 250 MCG/5ML IJ SOLN
INTRAMUSCULAR | Status: AC
  Filled 2015-03-02: qty 25

## 2015-03-02 MED ORDER — MIDAZOLAM HCL 5 MG/5ML IJ SOLN
INTRAMUSCULAR | Status: DC | PRN
Start: 2015-03-02 — End: 2015-03-02
  Administered 2015-03-02 (×2): 1 mg via INTRAVENOUS

## 2015-03-02 MED ORDER — PROMETHAZINE HCL 25 MG/ML IJ SOLN: 12.5000 mg | Freq: Four times a day (QID) | INTRAMUSCULAR | Status: DC | PRN

## 2015-03-02 MED ORDER — NEOSTIGMINE METHYLSULFATE 10 MG/10ML IV SOLN
INTRAVENOUS | Status: AC
  Filled 2015-03-02: qty 1

## 2015-03-02 MED ORDER — PROPOFOL 10 MG/ML IV BOLUS
INTRAVENOUS | Status: AC
  Filled 2015-03-02: qty 20

## 2015-03-02 MED ORDER — OXYCODONE HCL 5 MG/5ML PO SOLN
5.0000 mg | ORAL | Status: DC | PRN
  Administered 2015-03-03: 10 mg via ORAL
  Administered 2015-03-03 (×2): 5 mg via ORAL
  Administered 2015-03-04 (×2): 10 mg via ORAL
  Filled 2015-03-02: qty 5
  Filled 2015-03-02 (×4): qty 10
  Filled 2015-03-02: qty 5

## 2015-03-02 MED ORDER — GLYCOPYRROLATE 0.2 MG/ML IJ SOLN
INTRAMUSCULAR | Status: DC | PRN
  Administered 2015-03-02: .5 mg via INTRAVENOUS

## 2015-03-02 MED ORDER — ONDANSETRON HCL 4 MG/2ML IJ SOLN
INTRAMUSCULAR | Status: AC
  Filled 2015-03-02: qty 2

## 2015-03-02 MED ORDER — ACETAMINOPHEN 160 MG/5ML PO SOLN
650.0000 mg | ORAL | Status: DC | PRN
  Administered 2015-03-04: 650 mg via ORAL
  Filled 2015-03-02: qty 20.3

## 2015-03-02 MED ORDER — BUPIVACAINE LIPOSOME 1.3 % IJ SUSP
INTRAMUSCULAR | Status: DC | PRN
  Administered 2015-03-02: 20 mL

## 2015-03-02 MED ORDER — LACTATED RINGERS IR SOLN
Status: DC | PRN
  Administered 2015-03-02: 3000 mL

## 2015-03-02 MED ORDER — KCL IN DEXTROSE-NACL 20-5-0.45 MEQ/L-%-% IV SOLN
INTRAVENOUS | Status: DC
  Administered 2015-03-02: 17:00:00 via INTRAVENOUS
  Administered 2015-03-03: 125 mL/h via INTRAVENOUS
  Administered 2015-03-03 (×2): via INTRAVENOUS
  Filled 2015-03-02 (×7): qty 1000

## 2015-03-02 MED ORDER — UNJURY CHICKEN SOUP POWDER: 2.0000 [oz_av] | Freq: Four times a day (QID) | ORAL | Status: DC

## 2015-03-02 MED ORDER — SUCCINYLCHOLINE CHLORIDE 20 MG/ML IJ SOLN
INTRAMUSCULAR | Status: DC | PRN
  Administered 2015-03-02: 180 mg via INTRAVENOUS

## 2015-03-02 MED ORDER — BUPIVACAINE LIPOSOME 1.3 % IJ SUSP
20.0000 mL | Freq: Once | INTRAMUSCULAR | Status: DC
  Filled 2015-03-02: qty 20

## 2015-03-02 MED ORDER — PROMETHAZINE HCL 25 MG/ML IJ SOLN
6.2500 mg | INTRAMUSCULAR | Status: DC | PRN
  Administered 2015-03-02: 6.25 mg via INTRAVENOUS

## 2015-03-02 MED ORDER — ROCURONIUM BROMIDE 100 MG/10ML IV SOLN
INTRAVENOUS | Status: AC
  Filled 2015-03-02: qty 1

## 2015-03-02 MED ORDER — ACETAMINOPHEN 10 MG/ML IV SOLN
1000.0000 mg | Freq: Once | INTRAVENOUS | Status: AC
  Administered 2015-03-02: 1000 mg via INTRAVENOUS

## 2015-03-02 MED ORDER — NEOSTIGMINE METHYLSULFATE 10 MG/10ML IV SOLN
INTRAVENOUS | Status: DC | PRN
  Administered 2015-03-02: 4 mg via INTRAVENOUS

## 2015-03-02 MED ORDER — HEPARIN SODIUM (PORCINE) 5000 UNIT/ML IJ SOLN
5000.0000 [IU] | INTRAMUSCULAR | Status: AC
  Administered 2015-03-02: 5000 [IU] via SUBCUTANEOUS
  Filled 2015-03-02: qty 1

## 2015-03-02 MED ORDER — UNJURY CHOCOLATE CLASSIC POWDER
2.0000 [oz_av] | Freq: Four times a day (QID) | ORAL | Status: DC
  Administered 2015-03-04 (×2): 2 [oz_av] via ORAL

## 2015-03-02 MED ORDER — MIDAZOLAM HCL 2 MG/2ML IJ SOLN
INTRAMUSCULAR | Status: AC
  Filled 2015-03-02: qty 4

## 2015-03-02 MED ORDER — ROCURONIUM BROMIDE 100 MG/10ML IV SOLN
INTRAVENOUS | Status: DC | PRN
  Administered 2015-03-02: 20 mg via INTRAVENOUS
  Administered 2015-03-02: 10 mg via INTRAVENOUS
  Administered 2015-03-02: 40 mg via INTRAVENOUS
  Administered 2015-03-02: 20 mg via INTRAVENOUS
  Administered 2015-03-02 (×2): 10 mg via INTRAVENOUS

## 2015-03-02 MED ORDER — ACETAMINOPHEN 10 MG/ML IV SOLN
INTRAVENOUS | Status: AC
  Filled 2015-03-02: qty 100

## 2015-03-02 MED ORDER — ACETAMINOPHEN 10 MG/ML IV SOLN
1000.0000 mg | Freq: Four times a day (QID) | INTRAVENOUS | Status: AC
  Administered 2015-03-02 – 2015-03-03 (×4): 1000 mg via INTRAVENOUS
  Filled 2015-03-02 (×4): qty 100

## 2015-03-02 MED ORDER — PROMETHAZINE HCL 25 MG/ML IJ SOLN
INTRAMUSCULAR | Status: AC
Start: 2015-03-02 — End: 2015-03-03
  Filled 2015-03-02: qty 1

## 2015-03-02 MED ORDER — PROPOFOL 10 MG/ML IV BOLUS
INTRAVENOUS | Status: DC | PRN
  Administered 2015-03-02: 200 mg via INTRAVENOUS

## 2015-03-02 MED ORDER — DEXAMETHASONE SODIUM PHOSPHATE 10 MG/ML IJ SOLN
INTRAMUSCULAR | Status: AC
  Filled 2015-03-02: qty 1

## 2015-03-02 MED ORDER — METOCLOPRAMIDE HCL 5 MG/ML IJ SOLN
INTRAMUSCULAR | Status: AC
  Filled 2015-03-02: qty 2

## 2015-03-02 MED ORDER — FENTANYL CITRATE (PF) 100 MCG/2ML IJ SOLN
INTRAMUSCULAR | Status: AC
  Filled 2015-03-02: qty 4

## 2015-03-02 MED ORDER — PHENYLEPHRINE 40 MCG/ML (10ML) SYRINGE FOR IV PUSH (FOR BLOOD PRESSURE SUPPORT)
PREFILLED_SYRINGE | INTRAVENOUS | Status: AC
  Filled 2015-03-02: qty 10

## 2015-03-02 MED ORDER — METOCLOPRAMIDE HCL 5 MG/ML IJ SOLN
INTRAMUSCULAR | Status: DC | PRN
  Administered 2015-03-02: 10 mg via INTRAVENOUS

## 2015-03-02 MED ORDER — SODIUM CHLORIDE 0.9 % IJ SOLN
INTRAMUSCULAR | Status: AC
  Filled 2015-03-02: qty 100

## 2015-03-02 MED ORDER — EVICEL 5 ML EX KIT
2.0000 | PACK | Freq: Once | CUTANEOUS | Status: DC
  Filled 2015-03-02: qty 2

## 2015-03-02 SURGICAL SUPPLY — 66 items
APPLICATOR COTTON TIP 6IN STRL (MISCELLANEOUS) IMPLANT
BLADE SURG SZ11 CARB STEEL (BLADE) ×3 IMPLANT
CABLE HIGH FREQUENCY MONO STRZ (ELECTRODE) ×3 IMPLANT
CHLORAPREP W/TINT 26ML (MISCELLANEOUS) ×6 IMPLANT
CLIP SUT LAPRA TY ABSORB (SUTURE) ×6 IMPLANT
COVER SURGICAL LIGHT HANDLE (MISCELLANEOUS) IMPLANT
CUTTER FLEX LINEAR 45M (STAPLE) ×3 IMPLANT
DERMABOND ADVANCED (GAUZE/BANDAGES/DRESSINGS) ×1
DERMABOND ADVANCED .7 DNX12 (GAUZE/BANDAGES/DRESSINGS) ×2 IMPLANT
DEVICE SUT QUICK LOAD TK 5 (STAPLE) IMPLANT
DEVICE SUT TI-KNOT TK 5X26 (MISCELLANEOUS) ×3 IMPLANT
DEVICE SUTURE ENDOST 10MM (ENDOMECHANICALS) ×3 IMPLANT
DRAIN PENROSE 18X1/4 LTX STRL (WOUND CARE) ×3 IMPLANT
DRAPE CAMERA CLOSED 9X96 (DRAPES) ×3 IMPLANT
ELECT REM PT RETURN 9FT ADLT (ELECTROSURGICAL) ×3
ELECTRODE REM PT RTRN 9FT ADLT (ELECTROSURGICAL) ×2 IMPLANT
GAUZE SPONGE 4X4 12PLY STRL (GAUZE/BANDAGES/DRESSINGS) IMPLANT
GAUZE SPONGE 4X4 16PLY XRAY LF (GAUZE/BANDAGES/DRESSINGS) ×3 IMPLANT
GLOVE BIOGEL M STRL SZ7.5 (GLOVE) IMPLANT
GOWN STRL REUS W/TWL XL LVL3 (GOWN DISPOSABLE) ×18 IMPLANT
HOVERMATT SINGLE USE (MISCELLANEOUS) ×3 IMPLANT
KIT BASIN OR (CUSTOM PROCEDURE TRAY) ×3 IMPLANT
KIT GASTRIC LAVAGE 34FR ADT (SET/KITS/TRAYS/PACK) ×3 IMPLANT
LUBRICANT JELLY K Y 4OZ (MISCELLANEOUS) ×3 IMPLANT
NEEDLE SPNL 22GX3.5 QUINCKE BK (NEEDLE) ×3 IMPLANT
PACK CARDIOVASCULAR III (CUSTOM PROCEDURE TRAY) ×3 IMPLANT
PEN SKIN MARKING BROAD (MISCELLANEOUS) ×3 IMPLANT
RELOAD 45 VASCULAR/THIN (ENDOMECHANICALS) IMPLANT
RELOAD ENDO STITCH 2.0 (ENDOMECHANICALS) ×11
RELOAD STAPLE TA45 3.5 REG BLU (ENDOMECHANICALS) ×3 IMPLANT
RELOAD STAPLER BLUE 60MM (STAPLE) ×6 IMPLANT
RELOAD STAPLER GOLD 60MM (STAPLE) ×2 IMPLANT
RELOAD STAPLER WHITE 60MM (STAPLE) ×2 IMPLANT
SCISSORS LAP 5X45 EPIX DISP (ENDOMECHANICALS) ×3 IMPLANT
SEALANT SURGICAL APPL DUAL CAN (MISCELLANEOUS) IMPLANT
SET IRRIG TUBING LAPAROSCOPIC (IRRIGATION / IRRIGATOR) ×3 IMPLANT
SHEARS HARMONIC ACE PLUS 45CM (MISCELLANEOUS) ×3 IMPLANT
SLEEVE ADV FIXATION 12X100MM (TROCAR) ×6 IMPLANT
SLEEVE XCEL OPT CAN 5 100 (ENDOMECHANICALS) IMPLANT
SOLUTION ANTI FOG 6CC (MISCELLANEOUS) ×3 IMPLANT
STAPLER ECHELON BIOABSB 60 FLE (MISCELLANEOUS) IMPLANT
STAPLER ECHELON LONG 60 440 (INSTRUMENTS) ×3 IMPLANT
STAPLER RELOAD BLUE 60MM (STAPLE) ×9
STAPLER RELOAD GOLD 60MM (STAPLE) ×3
STAPLER RELOAD WHITE 60MM (STAPLE) ×3
SUT MNCRL AB 4-0 PS2 18 (SUTURE) ×3 IMPLANT
SUT RELOAD ENDO STITCH 2 48X1 (ENDOMECHANICALS) ×12
SUT RELOAD ENDO STITCH 2.0 (ENDOMECHANICALS) ×10
SUT SURGIDAC NAB ES-9 0 48 120 (SUTURE) IMPLANT
SUT VIC AB 2-0 SH 27 (SUTURE) ×1
SUT VIC AB 2-0 SH 27X BRD (SUTURE) ×2 IMPLANT
SUTURE RELOAD END STTCH 2 48X1 (ENDOMECHANICALS) ×12 IMPLANT
SUTURE RELOAD ENDO STITCH 2.0 (ENDOMECHANICALS) ×10 IMPLANT
SYR 10ML ECCENTRIC (SYRINGE) ×3 IMPLANT
SYR 20CC LL (SYRINGE) ×6 IMPLANT
TOWEL OR 17X26 10 PK STRL BLUE (TOWEL DISPOSABLE) ×3 IMPLANT
TOWEL OR NON WOVEN STRL DISP B (DISPOSABLE) ×3 IMPLANT
TRAY FOLEY W/METER SILVER 14FR (SET/KITS/TRAYS/PACK) ×3 IMPLANT
TRAY FOLEY W/METER SILVER 16FR (SET/KITS/TRAYS/PACK) IMPLANT
TROCAR ADV FIXATION 12X100MM (TROCAR) ×3 IMPLANT
TROCAR ADV FIXATION 5X100MM (TROCAR) ×3 IMPLANT
TROCAR BLADELESS OPT 5 100 (ENDOMECHANICALS) ×3 IMPLANT
TROCAR XCEL 12X100 BLDLESS (ENDOMECHANICALS) ×3 IMPLANT
TUBING CONNECTING 10 (TUBING) ×3 IMPLANT
TUBING ENDO SMARTCAP PENTAX (MISCELLANEOUS) ×3 IMPLANT
TUBING FILTER THERMOFLATOR (ELECTROSURGICAL) ×3 IMPLANT

## 2015-03-02 NOTE — Op Note (Signed)
Kylie Mitchell [id number] 11-16-1980 03/02/2015  Preoperative diagnosis: morbid obesity  Postoperative diagnosis: Same   Procedure: Upper endoscopy   Surgeon: Susy Frizzle B. Daphine Mitchell  M.D., FACS   Anesthesia: Gen.   Indications for procedure: This patient was undergoing a gastric bypass and is checked for bleeding or bubbles indicating a leak.    Description of procedure: The endoscopy was placed in the mouth and into the oropharynx and under endoscopic vision it was advanced to the esophagogastric junction.  The pouch was insufflated and was about 5 cm in length.  No bleeding was noted and the anastomosis was patent.  The EG junction was at 40 cm.  No bleeding or leaks were detected.  The scope was withdrawn without difficulty.     Kylie B. Daphine Deutscher, MD, FACS General, Bariatric, & Minimally Invasive Surgery [facility], [place]

## 2015-03-02 NOTE — Transfer of Care (Cosign Needed)
Immediate Anesthesia Transfer of Care Note  Patient: Kylie Mitchell  Procedure(s) Performed: Procedure(s): LAPAROSCOPIC ROUX-EN-Y GASTRIC BYPASS WITH UPPER ENDOSCOPY (N/A) UPPER GI ENDOSCOPY  Patient Location: PACU  Anesthesia Type:General  Level of Consciousness: Patient easily awoken, sedated, comfortable, cooperative, following commands, responds to stimulation.   Airway & Oxygen Therapy: Patient spontaneously breathing, ventilating well, oxygen via simple oxygen mask.  Post-op Assessment: Report given to PACU RN, vital signs reviewed and stable, moving all extremities.   Post vital signs: Reviewed and stable.  Complications: No apparent anesthesia complications

## 2015-03-02 NOTE — H&P (View-Only) (Signed)
Marajade M. Konkle 02/11/2015 11:36 AM Location: [facility] Patient #: [id number] DOB: [birth year] Married / Language: Cleophus Molt / Race: White Female History of Present Illness Randall Hiss M. Jadea Shiffer MD; 02/11/2015 12:19 PM) Patient words: bariatric.  The patient is a 34 year old female who presents for a preoperative evaluation. She has been approved for laparoscopic Roux-en-Y gastric bypass. I initially met her on January 28. Her weight at that time was 473 pounds. She denies any medical changes since she was initially seen. She did quit smoking in March. She recently started swimming on a regular basis and has lost about 35 pounds. She is swimming 5 times a week as well as doing water aerobics. She did see a cardiologist for clearance and was cleared for surgery. She denies any abdominal pain, nausea, vomiting, chest pain, chest pressure, shortness of breath. She states her knee pain has improved since she has been exercising.  Her upper GI was within normal limits. Her chest x-ray and EKG were normal. Her initial evaluation labs were unremarkable except for hemoglobin A1c of 6.6, HDL level 41 LDL level 128, iron at 37  a 12 point comprehensive ROS was performed and negative except for HPI Problem List/Past Medical Gayland Curry, MD; 02/11/2015 12:18 PM) GASTROESOPHAGEAL REFLUX DISEASE, ESOPHAGITIS PRESENCE NOT SPECIFIED (K21.9) ESSENTIAL HYPERTENSION (I10) CHRONIC PAIN OF BOTH KNEES (M25.561, M25.562) MORBID OBESITY WITH BMI OF 60.0-69.9, ADULT (E66.01) PREDIABETES (R73.09)  Other Problems Gayland Curry, MD; 02/11/2015 12:18 PM) Migraine Headache Anxiety Disorder Back Pain Depression  Past Surgical History Gayland Curry, MD; 02/11/2015 12:18 PM) Cesarean Section - 1  Diagnostic Studies History Gayland Curry, MD; 02/11/2015 12:18 PM) Colonoscopy never Mammogram never Pap Smear >5 years ago  Allergies ([place], [place]; 02/11/2015 11:36  AM) Penicillins Morphine Derivatives No Known Drug Allergies 07/03/2014 (Marked as Inactive)  Medication History Gayland Curry, MD; 02/11/2015 12:18 PM) Hydrochlorothiazide (25MG Tablet, Oral daily) Active. Losartan Potassium (50MG Tablet, Oral) Active. Ranitidine HCl (150MG Capsule, Oral) Active. Medications Reconciled Zofran ODT (4MG Tablet Disperse, 1 (one) Tablet Disperse Oral every six hours, as needed, Taken starting 02/11/2015) Active. OxyCODONE HCl (5MG/5ML Solution, 5-10 Milliliter Oral every four hours, as needed, Taken starting 02/11/2015) Active. Cyclobenzaprine HCl (10MG Tablet, Oral as needed) Active. Naproxen (500MG Tablet, Oral as needed) Active. Osteo Bi-Flex Adv Double St (Oral daily) Active. Potassium Chloride ER (10MEQ Capsule ER, Oral daily) Active. Magnesium Gluconate (250MG Tablet, Oral daily) Active. Ocuvite (dai Oral) Active.  Social History Gayland Curry, MD; 02/11/2015 12:18 PM) Caffeine use Coffee. Tobacco use Current some day smoker. Alcohol use Occasional alcohol use. Illicit drug use Remotely quit drug use.  Family History Gayland Curry, MD; 02/11/2015 12:18 PM) Arthritis Father. Heart disease in female family member before age 64 Hypertension Mother.  Pregnancy / Birth History Gayland Curry, MD; 02/11/2015 12:18 PM) Maternal age 44-20 Para 102 Age at menarche 52 years. Contraceptive History Depo-provera, Oral contraceptives. Irregular periods    Vitals (Sonya Bynum CMA; 02/11/2015 11:36 AM) 02/11/2015 11:36 AM Weight: 438.6 lb Height: 67.5in Body Surface Area: 3.08 m Body Mass Index: 67.68 kg/m Temp.: 54F(Temporal)  Pulse: 79 (Regular)  BP: 124/76 (Sitting, Left Arm, Standard)     Physical Exam Randall Hiss M. Shekia Kuper MD; 02/11/2015 12:11 PM)  The physical exam findings are as follows: Note:MORBIDLY OBESE, evenly distributed  General Mental Status-Alert. General Appearance-Consistent with stated  age. Hydration-Well hydrated. Voice-Normal.  Head and Neck Head-normocephalic, atraumatic with no lesions or palpable masses. Trachea-midline. Thyroid  Gland Characteristics - normal size and consistency.  Eye Eyeball - Bilateral-Extraocular movements intact. Sclera/Conjunctiva - Bilateral-No scleral icterus.  Chest and Lung Exam Chest and lung exam reveals -quiet, even and easy respiratory effort with no use of accessory muscles and on auscultation, normal breath sounds, no adventitious sounds and normal vocal resonance. Inspection Chest Wall - Normal. Back - normal.  Breast - Did not examine.  Cardiovascular Cardiovascular examination reveals -normal heart sounds, regular rate and rhythm with no murmurs and normal pedal pulses bilaterally.  Abdomen Inspection Inspection of the abdomen reveals - No Hernias. Skin - Scar - Note: Well-healed Pfannenstiel incision. Palpation/Percussion Palpation and Percussion of the abdomen reveal - Soft, Non Tender, No Rebound tenderness, No Rigidity (guarding) and No hepatosplenomegaly. Auscultation Auscultation of the abdomen reveals - Bowel sounds normal.  Peripheral Vascular Upper Extremity Palpation - Pulses bilaterally normal.  Neurologic Neurologic evaluation reveals -alert and oriented x 3 with no impairment of recent or remote memory. Mental Status-Normal.  Neuropsychiatric The patient's mood and affect are described as -normal. Judgment and Insight-insight is appropriate concerning matters relevant to self.  Musculoskeletal Normal Exam - Left-Upper Extremity Strength Normal and Lower Extremity Strength Normal. Normal Exam - Right-Upper Extremity Strength Normal and Lower Extremity Strength Normal. Note: some crepitus in b/l knees   Lymphatic Head & Neck  General Head & Neck Lymphatics: Bilateral - Description - Normal. Axillary - Did not examine. Femoral & Inguinal - Did not  examine.    Assessment & Plan Randall Hiss M. Garrett Mitchum MD; 02/11/2015 12:17 PM)  MORBID OBESITY WITH BMI OF 60.0-69.9, ADULT (E66.01) Impression: We reviewed her preoperative workup. We discussed her labs and the finding of her hemoglobin A1c. I imagine her hemoglobin A1c is improved slightly since she has lost about 35 pounds. I congratulated her on her weight loss. I highly encouraged her to continue with this postoperatively. All of her questions were asked and answered. Her husband recently had surgery as well so she is very familiar with the postop protocol. She was given prescriptions for postoperative pain and nausea medication today. She was given instructions for her bowel prep. I encouraged her to continue with the preoperative diet and swimming  Current Plans Pt Education - EMW_preopbariatric Started Zofran ODT 4MG, 1 (one) Tablet Disperse every six hours, as needed, #25, 02/11/2015, No Refill. Started OxyCODONE HCl 5MG/5ML, 5-10 Milliliter every four hours, as needed, 200 Milliliter, 02/11/2015, No Refill. CHRONIC PAIN OF BOTH KNEES (M25.561) Impression: Managed by PCP  ESSENTIAL HYPERTENSION (I10) Impression: Managed by PCP; cardiology cleared pt  GASTROESOPHAGEAL REFLUX DISEASE, ESOPHAGITIS PRESENCE NOT SPECIFIED (K21.9) Impression: managed by PCP  PREDIABETES (R73.09) Impression: will monitor over time  Leighton Ruff. Redmond Pulling, MD, FACS General, Bariatric, & Minimally Invasive Surgery [facility], [place]

## 2015-03-02 NOTE — Anesthesia Postprocedure Evaluation (Signed)
  Anesthesia Post-op Note  Patient: Kylie Mitchell  Procedure(s) Performed: Procedure(s) (LRB): LAPAROSCOPIC ROUX-EN-Y GASTRIC BYPASS WITH UPPER ENDOSCOPY (N/A) UPPER GI ENDOSCOPY  Patient Location: PACU  Anesthesia Type: General  Level of Consciousness: awake and alert   Airway and Oxygen Therapy: Patient Spontanous Breathing  Post-op Pain: mild  Post-op Assessment: Post-op Vital signs reviewed, Patient's Cardiovascular Status Stable, Respiratory Function Stable, Patent Airway and No signs of Nausea or vomiting  Last Vitals:  Filed Vitals:   03/02/15 1703  BP: 93/62  Pulse: 59  Temp: 36.7 C  Resp: 16    Post-op Vital Signs: stable   Complications: No apparent anesthesia complications

## 2015-03-02 NOTE — Anesthesia Preprocedure Evaluation (Signed)
Anesthesia Evaluation  Patient identified by MRN, date of birth, ID band Patient awake    Reviewed: Allergy & Precautions, NPO status , Patient's Chart, lab work & pertinent test results  Airway Mallampati: III  TM Distance: <3 FB Neck ROM: Full    Dental no notable dental hx.    Pulmonary neg pulmonary ROS, former smoker,    Pulmonary exam normal breath sounds clear to auscultation       Cardiovascular hypertension, Pt. on medications Normal cardiovascular exam Rhythm:Regular Rate:Normal     Neuro/Psych negative neurological ROS  negative psych ROS   GI/Hepatic negative GI ROS, Neg liver ROS,   Endo/Other  Morbid obesity  Renal/GU negative Renal ROS  negative genitourinary   Musculoskeletal negative musculoskeletal ROS (+)   Abdominal (+) + obese,   Peds negative pediatric ROS (+)  Hematology negative hematology ROS (+)   Anesthesia Other Findings   Reproductive/Obstetrics negative OB ROS                             Anesthesia Physical Anesthesia Plan  ASA: III  Anesthesia Plan: General   Post-op Pain Management:    Induction: Intravenous  Airway Management Planned: Oral ETT  Additional Equipment:   Intra-op Plan:   Post-operative Plan: Extubation in OR  Informed Consent: I have reviewed the patients History and Physical, chart, labs and discussed the procedure including the risks, benefits and alternatives for the proposed anesthesia with the patient or authorized representative who has indicated his/her understanding and acceptance.   Dental advisory given  Plan Discussed with: CRNA and Surgeon  Anesthesia Plan Comments:         Anesthesia Quick Evaluation

## 2015-03-02 NOTE — Interval H&P Note (Signed)
History and Physical Interval Note:  03/02/2015 9:31 AM  Kylie Mitchell  has presented today for surgery, with the diagnosis of Morbid Obesity  The various methods of treatment have been discussed with the patient and family. After consideration of risks, benefits and other options for treatment, the patient has consented to  Procedure(s): LAPAROSCOPIC ROUX-EN-Y GASTRIC BYPASS WITH UPPER ENDOSCOPY (N/A) as a surgical intervention .  The patient's history has been reviewed, patient examined, no change in status, stable for surgery.  I have reviewed the patient's chart and labs.  Questions were answered to the patient's satisfaction.    Mary Sella. Andrey Campanile, MD, FACS General, Bariatric, & Minimally Invasive Surgery [facility], [place]   [facility]

## 2015-03-02 NOTE — Anesthesia Procedure Notes (Signed)
Procedure Name: Intubation Date/Time: 03/02/2015 10:38 AM Performed by: Ludwig Lean Pre-anesthesia Checklist: Patient identified, Emergency Drugs available, Suction available and Patient being monitored Patient Re-evaluated:Patient Re-evaluated prior to inductionOxygen Delivery Method: Circle System Utilized Preoxygenation: Pre-oxygenation with 100% oxygen Intubation Type: IV induction Ventilation: Mask ventilation without difficulty Laryngoscope Size: Mac and 3 Grade View: Grade I Tube type: Oral Tube size: 7.5 mm Number of attempts: 1 Airway Equipment and Method: Oral airway Placement Confirmation: ETT inserted through vocal cords under direct vision,  positive ETCO2 and breath sounds checked- equal and bilateral Secured at: 19 cm Tube secured with: Tape Dental Injury: Teeth and Oropharynx as per pre-operative assessment

## 2015-03-02 NOTE — Op Note (Signed)
Kylie Mitchell [id number] [birth year]. 03/02/2015  Preoperative diagnosis:  1. Morbid obesity (BMI 68)  2. Hypertension 3. Osteoarthritis bilateral knees 4. GERD 5. Prediabetes  Postoperative  diagnosis:  1. same  Surgical procedure: Laparoscopic Roux-en-Y gastric bypass (ante-colic, ante-gastric); upper endoscopy  Surgeon: Atilano Ina, M.D. FACS  Asst.: Luretha Murphy MD FACS  Anesthesia: General plus 70cc exparel  Complications: None   EBL: Minimal   Drains: None   Disposition: PACU in good condition   Indications for procedure: 34yo female with morbid obesity who has been unsuccessful at sustained weight loss. The patient's comorbidities are listed above. We discussed the risk and benefits of surgery including but not limited to anesthesia risk, bleeding, infection, blood clot formation, anastomotic leak, anastomotic stricture, ulcer formation, death, respiratory complications, intestinal blockage, internal hernia, gallstone formation, vitamin and nutritional deficiencies, injury to surrounding structures, failure to lose weight and mood changes.   Description of procedure: Patient is brought to the operating room and general anesthesia induced. The patient had received preoperative broad-spectrum IV antibiotics and subcutaneous heparin. The abdomen was widely sterilely prepped with Chloraprep and draped. Patient timeout was performed and correct patient and procedure confirmed. Access was obtained with a 12 mm Optiview trocar in the left upper quadrant and pneumoperitoneum established without difficulty. Under direct vision 12 mm trocars were placed laterally in the right upper quadrant, right upper quadrant midclavicular line, and to the left and above the umbilicus for the camera port. A 5 mm trocar was placed laterally in the left upper quadrant. In the lower midline, the omentum was adhered to the anterior abdominal wall. This was taken down with harmonic scalpel.  The  omentum was brought into the upper abdomen and the transverse mesocolon elevated and the ligament of Treitz clearly identified. A 40 cm biliopancreatic limb was then carefully measured from the ligament of Treitz. The small intestine was divided at this point with a single firing of the white load linear stapler. A Penrose drain was sutured to the end of the Roux-en-Y limb for later identification. A 100 cm Roux-en-Y limb was then carefully measured. At this point a side-to-side anastomosis was created between the Roux limb and the end of the biliopancreatic limb. This was accomplished with a single firing of the 60 mm white load linear stapler. The common enterotomy was closed with a running 2-0 Vicryl begun at either end of the enterotomy and tied centrally. Eviceal tissue sealant was placed over the anastomosis. The mesenteric defect was then closed with running 2-0 silk. The omentum was then divided with the harmonic scalpel up towards the transverse colon to allow mobility of the Roux limb toward the gastric pouch. The patient was then placed in steep reversed Trendelenburg. Through a 5 mm subxiphoid site the [facility] retractor was placed and the left lobe of the liver elevated with excellent exposure of the upper stomach and hiatus. The angle of Hiss was then mobilized with the harmonic scalpel. A 5 cm gastric pouch was then carefully measured along the lesser curve of the stomach. Dissection was carried along the lesser curve at this point with the Harmonic scalpel working carefully back toward the lesser sac at right angles to the lesser curve. The free lesser sac was then entered. After being sure all tubes were removed from the stomach an initial firing of the gold load 60 mm linear stapler was fired at right angles across the lesser curve for about 5 cm. The gastric pouch was further mobilized posteriorly and  then the pouch was completed with 3 further firings of the 60 mm blue load linear stapler up  through the previously dissected angle of His. It was ensured that the pouch was completely mobilized away from the gastric remnant. This created a nice tubular 5 cm gastric pouch.  The Roux limb was then brought up in an antecolic fashion with the candycane facing to the patient's left without undue tension. The gastrojejunostomy was created with an initial posterior row of 2-0 Vicryl between the Roux limb and the staple line of the gastric pouch. Enterotomies were then made in the gastric pouch and the Roux limb with the harmonic scalpel and at approximately 2-2-1/2 cm anastomosis was created with a single firing of the 45mm blue load linear stapler. The staple line was inspected and was intact without bleeding. The common enterotomy was then closed with running 2-0 Vicryl begun at either end and tied centrally. The Ewall tube was then easily passed through the anastomosis and an outer anterior layer of running 2-0 Vicryl was placed. The Ewald tube was removed. With the outlet of the gastrojejunostomy clamped and under saline irrigation the assistant performed upper endoscopy and with the gastric pouch tensely distended with air-there was no evidence of leak on this test. The pouch was desufflated. The Vonita Moss defect was closed with running 2-0 silk. The abdomen was inspected for any evidence of bleeding or bowel injury and everything looked fine. The Nathanson retractor was removed under direct vision after coating the anastomosis with Eviceal tissue sealant. All CO2 was evacuated and trochars removed. Skin incisions were closed with 4-0 monocryl in a subcuticular fashion followed by Dermabond. Sponge needle and instrument counts were correct. The patient was taken to the PACU in good condition.    Kylie Mitchell. Kylie Campanile, MD, FACS General, Bariatric, & Minimally Invasive Surgery [facility], [place]

## 2015-03-03 ENCOUNTER — Inpatient Hospital Stay (HOSPITAL_COMMUNITY): Payer: Managed Care, Other (non HMO)

## 2015-03-03 LAB — CBC WITH DIFFERENTIAL/PLATELET
Basophils Absolute: 0 10*3/uL (ref 0.0–0.1)
Basophils Relative: 0 %
EOS ABS: 0 10*3/uL (ref 0.0–0.7)
EOS PCT: 0 %
HCT: 38.9 % (ref 36.0–46.0)
Hemoglobin: 12.8 g/dL (ref 12.0–15.0)
LYMPHS ABS: 1.3 10*3/uL (ref 0.7–4.0)
LYMPHS PCT: 9 %
MCH: 28.6 pg (ref 26.0–34.0)
MCHC: 32.9 g/dL (ref 30.0–36.0)
MCV: 87 fL (ref 78.0–100.0)
MONO ABS: 0.9 10*3/uL (ref 0.1–1.0)
MONOS PCT: 6 %
Neutro Abs: 12.7 10*3/uL — ABNORMAL HIGH (ref 1.7–7.7)
Neutrophils Relative %: 85 %
Platelets: 308 10*3/uL (ref 150–400)
RBC: 4.47 MIL/uL (ref 3.87–5.11)
RDW: 13.7 % (ref 11.5–15.5)
WBC: 14.9 10*3/uL — AB (ref 4.0–10.5)

## 2015-03-03 LAB — COMPREHENSIVE METABOLIC PANEL
ALT: 27 U/L (ref 14–54)
ANION GAP: 6 (ref 5–15)
AST: 33 U/L (ref 15–41)
Albumin: 3.4 g/dL — ABNORMAL LOW (ref 3.5–5.0)
Alkaline Phosphatase: 35 U/L — ABNORMAL LOW (ref 38–126)
BUN: 8 mg/dL (ref 6–20)
CHLORIDE: 106 mmol/L (ref 101–111)
CO2: 24 mmol/L (ref 22–32)
CREATININE: 0.67 mg/dL (ref 0.44–1.00)
Calcium: 8.9 mg/dL (ref 8.9–10.3)
Glucose, Bld: 134 mg/dL — ABNORMAL HIGH (ref 65–99)
POTASSIUM: 4.1 mmol/L (ref 3.5–5.1)
SODIUM: 136 mmol/L (ref 135–145)
Total Bilirubin: 0.6 mg/dL (ref 0.3–1.2)
Total Protein: 6.8 g/dL (ref 6.5–8.1)

## 2015-03-03 LAB — HEMOGLOBIN AND HEMATOCRIT, BLOOD
HCT: 34.9 % — ABNORMAL LOW (ref 36.0–46.0)
Hemoglobin: 11.4 g/dL — ABNORMAL LOW (ref 12.0–15.0)

## 2015-03-03 MED ORDER — IOHEXOL 300 MG/ML  SOLN
50.0000 mL | Freq: Once | INTRAMUSCULAR | Status: DC | PRN
  Administered 2015-03-03: 35 mL via ORAL
  Filled 2015-03-03: qty 50

## 2015-03-03 MED ORDER — DEXTROSE 5 % IV SOLN
500.0000 mg | Freq: Three times a day (TID) | INTRAVENOUS | Status: DC | PRN
Start: 2015-03-03 — End: 2015-03-04
  Filled 2015-03-03: qty 5

## 2015-03-03 NOTE — Care Management Note (Signed)
Case Management Note  Patient Details  Name: JAMILETT FERRANTE MRN: [id number] Date of Birth: Jun 14, 1980  Subjective/Objective:    Laparoscopic Roux-en-Y gastric bypass                 Action/Plan: Discharge planning  Expected Discharge Date:                  Expected Discharge Plan:  Home/Self Care  In-House Referral:  NA  Discharge planning Services  NA  Post Acute Care Choice:  NA Choice offered to:  NA  DME Arranged:  N/A DME Agency:  NA  HH Arranged:  NA HH Agency:  NA  Status of Service:  Completed, signed off  Medicare Important Message Given:    Date Medicare IM Given:    Medicare IM give by:    Date Additional Medicare IM Given:    Additional Medicare Important Message give by:     If discussed at Long Length of Stay Meetings, dates discussed:    Additional Comments:  Alexis Goodell, RN 03/03/2015, 10:41 AM

## 2015-03-03 NOTE — Progress Notes (Signed)
1 Day Post-Op  Subjective: Pt seen and examined earlier this am. Doing well. Less nausea than expected. Slept well. Pain "what I expected". Ambulated. Doing IS. Voiding spont  Objective: Vital signs in last 24 hours: Temp:  [98 F (36.7 C)-99.3 F (37.4 C)] 98.6 F (37 C) (09/27 1303) Pulse Rate:  [54-92] 61 (09/27 1303) Resp:  [13-18] 18 (09/27 1303) BP: (93-140)/(56-96) 113/61 mmHg (09/27 1303) SpO2:  [94 %-100 %] 100 % (09/27 1303) Weight:  [195 kg (429 lb 14.4 oz)] 195 kg (429 lb 14.4 oz) (09/27 0200) Last BM Date: 03/02/15  Intake/Output from previous day: 09/26 0701 - 09/27 0700 In: 3670.8 [I.V.:3470.8; IV Piggyback:200] Out: 2100 [Urine:2100] Intake/Output this shift: Total I/O In: -  Out: 100 [Urine:100]  Alert, nontoxic, smiling cta ant b/l Reg Obese, soft, nd,  +SCDs  Lab Results:   Recent Labs  03/02/15 1530 03/03/15 0450  WBC  --  14.9*  HGB 12.6 12.8  HCT 39.1 38.9  PLT  --  308   BMET  Recent Labs  03/03/15 0450  NA 136  K 4.1  CL 106  CO2 24  GLUCOSE 134*  BUN 8  CREATININE 0.67  CALCIUM 8.9   PT/INR No results for input(s): LABPROT, INR in the last 72 hours. ABG No results for input(s): PHART, HCO3 in the last 72 hours.  Invalid input(s): PCO2, PO2  Studies/Results: Dg Ugi W/water Sol Cm  03/03/2015   CLINICAL DATA:  Patient status post Roux-en-Y gastric bypass surgery.  EXAM: WATER SOLUBLE UPPER GI SERIES  TECHNIQUE: Single-column upper GI series was performed using water soluble contrast.  CONTRAST:  35mL OMNIPAQUE IOHEXOL 300 MG/ML  SOLN  COMPARISON:  None.  FLUOROSCOPY TIME:  Radiation Exposure Index (as provided by the fluoroscopic device):  If the device does not provide the exposure index:  Fluoroscopy Time (in minutes and seconds):  0 minutes 46 seconds  Number of Acquired Images:  8  FINDINGS: Contrast flows to the gastroesophageal junction into the gastric remnant pouch. Contrast flows readily through the gastrojejunostomy  into the proximal small bowel. No evidence of leak or obstruction at the bypass site. No significant residual contrast in the esophagus. 35 mL of water-soluble contrast administered.  IMPRESSION: No evidence of leak or obstruction at the gastrojejunostomy following Roux-en-Y gastric bypass surgery.   Electronically Signed   By: Genevive Bi M.D.   On: 03/03/2015 10:40    Anti-infectives: Anti-infectives    Start     Dose/Rate Route Frequency Ordered Stop   03/02/15 0600  levofloxacin (LEVAQUIN) IVPB 750 mg     750 mg 100 mL/hr over 90 Minutes Intravenous On call to O.R. 03/01/15 1426 03/02/15 1039      Assessment/Plan: s/p Procedure(s): LAPAROSCOPIC ROUX-EN-Y GASTRIC BYPASS WITH UPPER ENDOSCOPY (N/A) UPPER GI ENDOSCOPY Looks good. Vitals ok. For UGI this am, if ok start water Ambulate, pulm toilet IS,  vte prophylaxis  Mary Sella. Andrey Campanile, MD, FACS General, Bariatric, & Minimally Invasive Surgery [facility], [place]   LOS: 1 day    Atilano Ina 03/03/2015

## 2015-03-03 NOTE — Progress Notes (Signed)
Patient alert and oriented, Post op day 1.  Provided support and encouragement.  Encouraged pulmonary toilet, ambulation and small sips of liquids when swallow study returned satisfactorily.  All questions answered.  Will continue to monitor. 

## 2015-03-03 NOTE — Plan of Care (Signed)
Problem: Food- and Nutrition-Related Knowledge Deficit (NB-1.1) Goal: Nutrition education Formal process to instruct or train a patient/client in a skill or to impart knowledge to help patients/clients voluntarily manage or modify food choices and eating behavior to maintain or improve health. Outcome: Completed/Met Date Met:  03/03/15 Nutrition Education Note  Received consult for diet education per DROP protocol.   Discussed 2 week post op diet with pt. Emphasized that liquids must be non carbonated, non caffeinated, and sugar free. Fluid goals discussed. Pt to follow up with outpatient bariatric RD for further diet progression after 2 weeks. Multivitamins and minerals also reviewed. Teach back method used, pt expressed understanding, expect good compliance.   Diet: First 2 Weeks  You will see the nutritionist about two (2) weeks after your surgery. The nutritionist will increase the types of foods you can eat if you are handling liquids well:  If you have severe vomiting or nausea and cannot handle clear liquids lasting longer than 1 day, call your surgeon  Protein Shake  Drink at least 2 ounces of shake 5-6 times per day  Each serving of protein shakes (usually 8 - 12 ounces) should have a minimum of:  15 grams of protein  And no more than 5 grams of carbohydrate  Goal for protein each day:  Men = 80 grams per day  Women = 60 grams per day  Protein powder may be added to fluids such as non-fat milk or Lactaid milk or Soy milk (limit to 35 grams added protein powder per serving)   Hydration  Slowly increase the amount of water and other clear liquids as tolerated (See Acceptable Fluids)  Slowly increase the amount of protein shake as tolerated  Sip fluids slowly and throughout the day  May use sugar substitutes in small amounts (no more than 6 - 8 packets per day; i.e. Splenda)   Fluid Goal  The first goal is to drink at least 8 ounces of protein shake/drink per day (or as directed  by the nutritionist); some examples of protein shakes are [employer], [employer], EAS Edge HP, and Unjury. See handout from pre-op Bariatric Education Class:  Slowly increase the amount of protein shake you drink as tolerated  You may find it easier to slowly sip shakes throughout the day  It is important to get your proteins in first  Your fluid goal is to drink 64 - 100 ounces of fluid daily  It may take a few weeks to build up to this  32 oz (or more) should be clear liquids  And  32 oz (or more) should be full liquids (see below for examples)  Liquids should not contain sugar, caffeine, or carbonation   Clear Liquids:  Water or Sugar-free flavored water (i.e. Fruit H2O, Propel)  Decaffeinated coffee or tea (sugar-free)  Crystal Lite, Wyler's Lite, Minute Maid Lite  Sugar-free Jell-O  Bouillon or broth  Sugar-free Popsicle: *Less than 20 calories each; Limit 1 per day   Full Liquids:  Protein Shakes/Drinks + 2 choices per day of other full liquids  Full liquids must be:  No More Than 12 grams of Carbs per serving  No More Than 3 grams of Fat per serving  Strained low-fat cream soup  Non-Fat milk  Fat-free Lactaid Milk  Sugar-free yogurt (Dannon Lite & Fit, Greek yogurt)     Clayton Bibles, MS, RD, LDN Pager: [phone] After Hours Pager: [phone]

## 2015-03-04 LAB — CBC WITH DIFFERENTIAL/PLATELET
BASOS PCT: 0 %
Basophils Absolute: 0 10*3/uL (ref 0.0–0.1)
EOS PCT: 1 %
Eosinophils Absolute: 0.1 10*3/uL (ref 0.0–0.7)
HEMATOCRIT: 35.2 % — AB (ref 36.0–46.0)
Hemoglobin: 11.1 g/dL — ABNORMAL LOW (ref 12.0–15.0)
Lymphocytes Relative: 27 %
Lymphs Abs: 3.5 10*3/uL (ref 0.7–4.0)
MCH: 27.5 pg (ref 26.0–34.0)
MCHC: 31.5 g/dL (ref 30.0–36.0)
MCV: 87.3 fL (ref 78.0–100.0)
MONO ABS: 1.1 10*3/uL — AB (ref 0.1–1.0)
MONOS PCT: 8 %
NEUTROS ABS: 8.2 10*3/uL — AB (ref 1.7–7.7)
Neutrophils Relative %: 64 %
PLATELETS: 245 10*3/uL (ref 150–400)
RBC: 4.03 MIL/uL (ref 3.87–5.11)
RDW: 14 % (ref 11.5–15.5)
WBC: 12.9 10*3/uL — ABNORMAL HIGH (ref 4.0–10.5)

## 2015-03-04 MED ORDER — ENOXAPARIN SODIUM 40 MG/0.4ML ~~LOC~~ SOLN: 40.0000 mg | SUBCUTANEOUS | Status: AC

## 2015-03-04 MED ORDER — ENOXAPARIN (LOVENOX) PATIENT EDUCATION KIT
PACK | Freq: Once | Status: AC
  Administered 2015-03-04: 13:00:00
  Filled 2015-03-04: qty 1

## 2015-03-04 MED ORDER — OXYCODONE HCL 5 MG/5ML PO SOLN: 5.0000 mg | ORAL | Status: AC | PRN

## 2015-03-04 NOTE — Discharge Instructions (Addendum)
° ° ° °GASTRIC BYPASS/SLEEVE ° Home Care Instructions ° ° These instructions are to help you care for yourself when you go home. ° °Call: If you have any problems. °• Call [phone] and ask for the surgeon on call °• If you need immediate assistance come to the ER at [facility]. Tell the ER staff you are a new post-op gastric bypass or gastric sleeve patient  °Signs and symptoms to report: • Severe  vomiting or nausea °o If you cannot handle clear liquids for longer than 1 day, call your surgeon °• Abdominal pain which does not get better after taking your pain medication °• Fever greater than 100.4°  F and chills °• Heart rate over 100 beats a minute °• Trouble breathing °• Chest pain °• Redness,  swelling, drainage, or foul odor at incision (surgical) sites °• If your incisions open or pull apart °• Swelling or pain in calf (lower leg) °• Diarrhea (Loose bowel movements that happen often), frequent watery, uncontrolled bowel movements °• Constipation, (no bowel movements for 3 days) if this happens: °o Take Milk of Magnesia, 2 tablespoons by mouth, 3 times a day for 2 days if needed °o Stop taking Milk of Magnesia once you have had a bowel movement °o Call your doctor if constipation continues °Or °o Take Miralax  (instead of Milk of Magnesia) following the label instructions °o Stop taking Miralax once you have had a bowel movement °o Call your doctor if constipation continues °• Anything you think is “abnormal for you” °  °Normal side effects after surgery: • Unable to sleep at night or unable to concentrate °• Irritability °• Being tearful (crying) or depressed ° °These are common complaints, possibly related to your anesthesia, stress of surgery, and change in lifestyle, that usually go away a few weeks after surgery. If these feelings continue, call your medical doctor.  °Wound Care: You may have surgical glue, steri-strips, or staples over your incisions after surgery °• Surgical glue: Looks like clear  film over your incisions and will wear off a little at a time °• Steri-strips: Adhesive strips of tape over your incisions. You may notice a yellowish color on skin under the steri-strips. This is used to make the steri-strips stick better. Do not pull the steri-strips off - let them fall off °• Staples: Staples may be removed before you leave the hospital °o If you go home with staples, call [facility] for an appointment with your surgeon’s nurse to have staples removed 10 days after surgery, [phone] °• Showering: You may shower two (2) days after your surgery unless your surgeon tells you differently °o Wash gently around incisions with warm soapy water, rinse well, and gently pat dry °o If you have a drain (tube from your incision), you may need someone to hold this while you shower °o No tub baths until staples are removed and incisions are healed °  °Medications: • Medications should be liquid or crushed if larger than the size of a dime °• Extended release pills (medication that releases a little bit at a time through the  day) should not be crushed °• Depending on the size and number of medications you take, you may need to space (take a few throughout the day)/change the time you take your medications so that you do not over-fill your pouch (smaller stomach) °• Make sure you follow-up with you primary care physician to make medication changes needed during rapid weight loss and life -style changes °•   If you have diabetes, follow up with your doctor that orders your diabetes medication(s) within one week after surgery and check your blood sugar regularly ° °• Do not drive while taking narcotics (pain medications) ° °• Do not take acetaminophen (Tylenol) and Roxicet or Lortab Elixir at the same time since these pain medications contain acetaminophen °  °Diet:  °First 2 Weeks You will see the nutritionist about two (2) weeks after your surgery. The nutritionist will increase the types of  foods you can eat if you are handling liquids well: °• If you have severe vomiting or nausea and cannot handle clear liquids lasting longer than 1 day call your surgeon °Protein Shake °• Drink at least 2 ounces of shake 5-6 times per day °• Each serving of protein shakes (usually 8-12 ounces) should have a minimum of: °o 15 grams of protein °o And no more than 5 grams of carbohydrate °• Goal for protein each day: °o Men = 80 grams per day °o Women = 60 grams per day °  ° • Protein powder may be added to fluids such as non-fat milk or Lactaid milk or Soy milk (limit to 35 grams added protein powder per serving) ° °Hydration °• Slowly increase the amount of water and other clear liquids as tolerated (See Acceptable Fluids) °• Slowly increase the amount of protein shake as tolerated °• Sip fluids slowly and throughout the day °• May use sugar substitutes in small amounts (no more than 6-8 packets per day; i.e. Splenda) ° °Fluid Goal °• The first goal is to drink at least 8 ounces of protein shake/drink per day (or as directed by the nutritionist); some examples of protein shakes are Syntrax Nectar, Adkins Advantage, EAS Edge HP, and Unjury. - See handout from pre-op Bariatric Education Class: °o Slowly increase the amount of protein shake you drink as tolerated °o You may find it easier to slowly sip shakes throughout the day °o It is important to get your proteins in first °• Your fluid goal is to drink 64-100 ounces of fluid daily °o It may take a few weeks to build up to this  °• 32 oz. (or more) should be clear liquids °And °• 32 oz. (or more) should be full liquids (see below for examples) °• Liquids should not contain sugar, caffeine, or carbonation ° °Clear Liquids: °• Water of Sugar-free flavored water (i.e. Fruit H²O, Propel) °• Decaffeinated coffee or tea (sugar-free) °• Crystal lite, Wyler’s Lite, Minute Maid Lite °• Sugar-free Jell-O °• Bouillon or broth °• Sugar-free Popsicle:    - Less than 20 calories  each; Limit 1 per day ° °Full Liquids: °                  Protein Shakes/Drinks + 2 choices per day of other full liquids °• Full liquids must be: °o No More Than 12 grams of Carbs per serving °o No More Than 3 grams of Fat per serving °• Strained low-fat cream soup °• Non-Fat milk °• Fat-free Lactaid Milk °• Sugar-free yogurt (Dannon Lite & Fit, Greek yogurt) ° °  °Vitamins and Minerals • Start 1 day after surgery unless otherwise directed by your surgeon °• 2 Chewable Multivitamin / Multimineral Supplement with iron (i.e. Centrum for Adults) °• Vitamin B-12, 350-500 micrograms sub-lingual (place tablet under the tongue) each day °• Chewable Calcium Citrate with Vitamin D-3 °(Example: 3 Chewable Calcium  Plus 600 with Vitamin D-3) °o Take 500 mg three (3) times a day for   a total of 1500 mg each day °o Do not take all 3 doses of calcium at one time as it may cause constipation, and you can only absorb 500 mg at a time °o Do not mix multivitamins containing iron with calcium supplements;  take 2 hours apart °o Do not substitute Tums (calcium carbonate) for your calcium °• Menstruating women and those at risk for anemia ( a blood disease that causes weakness) may need extra iron °o Talk to your doctor to see if you need more iron °• If you need extra iron: Total daily Iron recommendation (including Vitamins) is 50 to 100 mg Iron/day °• Do not stop taking or change any vitamins or minerals until you talk to your nutritionist or surgeon °• Your nutritionist and/or surgeon must approve all vitamin and mineral supplements °  °Activity and Exercise: It is important to continue walking at home. Limit your physical activity as instructed by your doctor. During this time, use these guidelines: °• Do not lift anything greater than ten  (10) pounds for at least two (2) weeks °• Do not go back to work or drive until your surgeon says you can °• You may have sex when you feel comfortable °o It is VERY important for female  patients to use a reliable birth control method; fertility often increase after surgery °o Do not get pregnant for at least 18 months °• Start exercising as soon as your doctor tells you that you can °o Make sure your doctor approves any physical activity °• Start with a simple walking program °• Walk 5-15 minutes each day, 7 days per week °• Slowly increase until you are walking 30-45 minutes per day °• Consider joining our BELT program. [phone] or email [email or web address] °  °Special Instructions Things to remember: °• Free counseling is available for you and your family through collaboration between [facility] and [employer]. Please call [phone] and leave a message °• Use your CPAP when sleeping if this applies to you °• Consider buying a medical alert bracelet that says you had lap-band surgery °  °  You will likely have your first fill (fluid added to your band) 6 - 8 weeks after surgery °• [facility] has a free Bariatric Surgery Support Group that meets monthly, the 3rd Thursday, 6pm.  Education Center Classrooms. You can see classes online at [email or web address] °• It is very important to keep all follow up appointments with your surgeon, nutritionist, primary care physician, and behavioral health practitioner °o After the first year, please follow up with your bariatric surgeon and nutritionist at least once a year in order to maintain best weight loss results °      °             [facility]:  [phone] ° °             [facility]: [phone] ° °             Bariatric Nurse Coordinator: [phone]  °Gastric Bypass/Sleeve Home Care Instructions  Rev. 07/2012    ° °                                                    Reviewed and Endorsed °                                                     by [facility] Patient Education Committee, Jan, 2014 ° ° ° ° ° ° ° ° °Enoxaparin, Home Use °Enoxaparin (Lovenox®) injection is a  medication used to prevent clots from developing in your veins. Medications such as enoxaparin are called blood thinners or anticoagulants. If blood clots are untreated they could travel to your lungs. This is called a pulmonary embolus. A blood clot in your lungs can be fatal. °Caregivers often use anticoagulants such as enoxaparin to prevent clots following surgery. It is also used along with aspirin when the heart is not getting enough blood. °Continue the enoxaparin injections as directed by your caregiver. Your caregiver will use blood clotting test results to decide when you can safely stop using enoxaparin injections. If your caregiver prescribes any additional anticoagulant, you must take it exactly as directed. °RISKS AND COMPLICATIONS °· If you have received recent epidural anesthesia, spinal anesthesia, or a spinal tap while receiving anticoagulants, you are at risk for developing a blood clot in or around the spine. This condition could result in long-term or permanent paralysis. °· Because anticoagulants thin your blood, severe bleeding may occur from any tissue or organ. Symptoms of the blood being too thin may include: °¨ Bleeding from the nose or gums that does not stop quickly. °¨ Unusual bruising or bruising easily. °¨ Swelling or pain at an injection site. °¨ A cut that does not stop bleeding within 10 minutes. °¨ Continual nausea for more than 1 day or vomiting blood. °¨ Coughing up blood. °¨ Blood in the urine which may appear as pink, red, or brown urine. °¨ Blood in bowel movements which may appear as red, dark or black stools. °¨ Sudden weakness or numbness of the face, arm, or leg, especially on one side of the body. °¨ Sudden confusion. °¨ Trouble speaking (aphasia) or understanding. °¨ Sudden trouble seeing in one or both eyes. °¨ Sudden trouble walking. °¨ Dizziness. °¨ Loss of balance or coordination. °¨ Severe pain, such as a headache, joint pain, or back pain. °¨ Fever. °· Bruising  around the injection sites may be expected. °· Platelet drops, known as "thrombocytopenia," can occur with enoxaparin use. A condition called "heparin-induced thrombocytopenia" has been seen. If you have had this condition, you should tell your caregiver. Your caregiver may direct you to have blood tests to monitor this condition. °· Do not use if you have allergies to the medication, heparin, or pork products. °· Other side effects may include mild local reactions or irritation at the site of injection, pain, bruising, and redness of skin. °HOME CARE INSTRUCTIONS °You will be instructed by your caregiver how to give enoxaparin injections. °1. Before giving your medication you should make sure the injection is a clear and colorless or pale yellow solution. If your medication becomes discolored or has particles in the bottle, do not use and notify your caregiver. °2. When using the 30 and 40 mg pre-filled syringes, do not expel the air bubble from the syringe before the injection. This makes sure you use all the medication in the syringe. °3. The injections will be given subcutaneously. This means it is given into the fat over the belly (abdomen). It is given deep beneath the skin but not into the muscle. The shots should be injected around the abdominal wall. Change the sites of injection each time. The whole length of the needle should be introduced into a skin fold held between the thumb and forefinger; the skin fold should be held throughout the injection. Do   not rub the injection site after completion of the injection. This increases bruising. Enoxaparin injection pre-filled syringes and graduated pre-filled syringes are available with a system that shields the needle after injection. °4. Inject by pushing the plunger to the bottom of the syringe. °5. Remove the syringe from the injection site keeping your finger on the plunger rod. Be careful not to stick yourself or others. °6. After injection and the syringe  is empty, set off the safety system by firmly pushing the plunger rod. The protective sleeve will automatically cover the needle and you can hear a click. The click means your needle is safely covered. Do not try replacing the needle shield. °7. Get rid of the syringe in the nearest sharps container. °8. Keep your medication safely stored at room temperatures. °· Due to the complications of anticoagulants, it is very important that you take your anticoagulant as directed by your caregiver. Anticoagulants need to be taken exactly as instructed. Be sure you understand all your anticoagulant instructions. °· Changes in medicines, supplements, diet, and illness can affect your anticoagulation therapy. Be sure to inform your caregivers of any of these changes. °· While on anticoagulants, you will need to have blood tests done routinely as directed by your caregivers. °· Be careful not to cut yourself when using sharp objects. °· Limit physical activities or sports that could result in a fall or cause injury. °· It is extremely important that you tell all of your caregivers and dentist that you are taking an anticoagulant, especially if you are injured or plan to have any type of procedure or operation. °· Follow up with your laboratory test and caregiver appointments as directed. It is very important to keep your appointments. Not keeping appointments could result in a chronic or permanent injury, pain, or disability. °SEEK MEDICAL CARE IF: °· You develop any rashes. °· You have any worsening of the condition for which you are receiving anticoagulation therapy. °SEEK IMMEDIATE MEDICAL CARE IF: °· Bleeding from the nose or gums does not stop quickly. °· You have unusual bruising or are bruising easily. °· Swelling or pain occurs at an injection site. °· A cut does not stop bleeding within 10 minutes. °· You have continual nausea for more than 1 day or are vomiting blood. °· You are coughing up blood. °· You have blood in  the urine. °· You have dark or black stools. °· You have sudden weakness or numbness of the face, arm, or leg, especially on one side of the body. °· You have sudden confusion. °· You have trouble speaking (aphasia) or understanding. °· You have sudden trouble seeing in one or both eyes. °· You have sudden trouble walking. °· You have dizziness. °· You have a loss of balance or coordination. °· You have severe pain, such as a headache, joint pain, or back pain. °· You have a serious fall or head injury, even if you are not bleeding. °· You have an oral temperature above 102° F (38.9° C), not controlled by medicine. °ANY OF THESE SYMPTOMS MAY REPRESENT A SERIOUS PROBLEM THAT IS AN EMERGENCY. Do not wait to see if the symptoms will go away. Get medical help right away. Call your local emergency services (911 in [place] DO NOT drive yourself to the hospital. °MAKE SURE YOU: °· Understand these instructions. °· Will watch your condition. °· Will get help right away if you are not doing well or get worse. °Document Released: 03/24/2004 Document Revised: 08/15/2011 Document Reviewed: 05/23/2005 °  ExitCare® Patient Information ©2015 [employer]. This information is not intended to replace advice given to you by your health care provider. Make sure you discuss any questions you have with your health care provider. ° °

## 2015-03-04 NOTE — Discharge Summary (Signed)
Physician Discharge Summary  Kylie Mitchell ZOX:[id number] DOB: 01/06/1981 DOA: 03/02/2015  PCP: Jarrett Soho, PA-C  Admit date: 03/02/2015 Discharge date: 03/04/2015  Recommendations for Outpatient Follow-up:   Follow-up Information    Follow up with Atilano Ina, MD. Go on 03/18/2015.   Specialty:  General Surgery   Why:  For Post-Op Check at 10:15   Contact information:   [address] [place] [address] [phone]      Discharge Diagnoses:  Active Problems:   Morbid obesity   HTN (hypertension)   Osteoarthritis of both knees   GERD (gastroesophageal reflux disease)   Prediabetes   S/P gastric bypass   Surgical Procedure: Laparoscopic Roux-en-Y gastric bypass, upper endoscopy  Discharge Condition: Good Disposition: Home  Diet recommendation: Postoperative gastric bypass diet  Filed Weights   03/02/15 0815 03/03/15 0200 03/04/15 0226  Weight: 194.65 kg (429 lb 2 oz) 195 kg (429 lb 14.4 oz) 196.1 kg (432 lb 5.2 oz)     Hospital Course:  The patient was admitted for a planned laparoscopic Roux-en-Y gastric bypass. Please see operative note. Preoperatively the patient was given 5000 units of subcutaneous heparin for DVT prophylaxis. Postoperative prophylactic Lovenox dosing was started on the morning of postoperative day 1. The patient underwent an upper GI on postoperative day 1 which demonstrated no extravasation of contrast and emptying of the contrast into the Roux limb. The patient was started on ice chips and water which they tolerated. On postoperative day 2 The patient's diet was advanced to protein shakes which they also tolerated. The patient was ambulating without difficulty. Their vital signs are stable without fever or tachycardia. Their hemoglobin had remained stable.  The patient had received discharge instructions and counseling. They were deemed stable for discharge.   Because of her BMI, I decided to send her out on extended  chemical dvt prophylaxis consisting of lovenox once a day. She was educated on how to give herself injections.   Her blood pressure had remained normal while in the hospital therefore I held her BP medications on discharge. She was instructed to monitor her BP  BP 110/49 mmHg  Pulse 60  Temp(Src) 98.2 F (36.8 C) (Oral)  Resp 18  Ht 5' 6.75" (1.695 m)  Wt 196.1 kg (432 lb 5.2 oz)  BMI 68.26 kg/m2  SpO2 97%  LMP 02/16/2015 (Exact Date)  Gen: alert, NAD, non-toxic appearing Pupils: equal, no scleral icterus Pulm: Lungs clear to auscultation, symmetric chest rise CV: regular rate and rhythm Abd: soft, min to non tender, nondistended.  No cellulitis. No incisional hernia Ext: no edema, no calf tenderness Skin: no rash, no jaundice  Discharge Instructions  Discharge Instructions    Ambulate hourly while awake    Complete by:  As directed      Call MD for:  difficulty breathing, headache or visual disturbances    Complete by:  As directed      Call MD for:  persistant dizziness or light-headedness    Complete by:  As directed      Call MD for:  persistant nausea and vomiting    Complete by:  As directed      Call MD for:  redness, tenderness, or signs of infection (pain, swelling, redness, odor or green/yellow discharge around incision site)    Complete by:  As directed      Call MD for:  severe uncontrolled pain    Complete by:  As directed      Call  MD for:  temperature >101 F    Complete by:  As directed      Diet bariatric full liquid    Complete by:  As directed      Discharge instructions    Complete by:  As directed   See bariatric discharge instructions and lovenox instructions     Incentive spirometry    Complete by:  As directed   Perform hourly while awake            Medication List    STOP taking these medications        hydrochlorothiazide 25 MG tablet  Commonly known as:  HYDRODIURIL     losartan 50 MG tablet  Commonly known as:  COZAAR      medroxyPROGESTERone 10 MG tablet  Commonly known as:  PROVERA      TAKE these medications        acetaminophen 500 MG tablet  Commonly known as:  TYLENOL  Take 500 mg by mouth every 6 (six) hours as needed for mild pain.     enoxaparin 40 MG/0.4ML injection  Commonly known as:  LOVENOX  Inject 0.4 mLs (40 mg total) into the skin daily.     multivitamin with minerals Tabs tablet  Take 1 tablet by mouth daily.     oxyCODONE 5 MG/5ML solution  Commonly known as:  ROXICODONE  Take 5-10 mLs (5-10 mg total) by mouth every 4 (four) hours as needed for moderate pain or severe pain.     ranitidine 150 MG capsule  Commonly known as:  ZANTAC  Take 150 mg by mouth every evening.           Follow-up Information    Follow up with Atilano Ina, MD. Go on 03/18/2015.   Specialty:  General Surgery   Why:  For Post-Op Check at 10:15   Contact information:   [address] [place] [address] [phone]        The results of significant diagnostics from this hospitalization (including imaging, microbiology, ancillary and laboratory) are listed below for reference.    Significant Diagnostic Studies: Dg Ugi W/water Sol Cm  03/03/2015   CLINICAL DATA:  Patient status post Roux-en-Y gastric bypass surgery.  EXAM: WATER SOLUBLE UPPER GI SERIES  TECHNIQUE: Single-column upper GI series was performed using water soluble contrast.  CONTRAST:  35mL OMNIPAQUE IOHEXOL 300 MG/ML  SOLN  COMPARISON:  None.  FLUOROSCOPY TIME:  Radiation Exposure Index (as provided by the fluoroscopic device):  If the device does not provide the exposure index:  Fluoroscopy Time (in minutes and seconds):  0 minutes 46 seconds  Number of Acquired Images:  8  FINDINGS: Contrast flows to the gastroesophageal junction into the gastric remnant pouch. Contrast flows readily through the gastrojejunostomy into the proximal small bowel. No evidence of leak or obstruction at the bypass site. No significant residual  contrast in the esophagus. 35 mL of water-soluble contrast administered.  IMPRESSION: No evidence of leak or obstruction at the gastrojejunostomy following Roux-en-Y gastric bypass surgery.   Electronically Signed   By: Genevive Bi M.D.   On: 03/03/2015 10:40    Labs: Basic Metabolic Panel:  Recent Labs Lab 02/26/15 0830 03/03/15 0450  NA 137 136  K 3.2* 4.1  CL 101 106  CO2 27 24  GLUCOSE 99 134*  BUN 9 8  CREATININE 0.71 0.67  CALCIUM 9.2 8.9   Liver Function Tests:  Recent Labs Lab 02/26/15 0830 03/03/15 0450  AST 25 33  ALT 22 27  ALKPHOS 39 35*  BILITOT 0.4 0.6  PROT 7.2 6.8  ALBUMIN 3.7 3.4*    CBC:  Recent Labs Lab 02/26/15 0830 03/02/15 1530 03/03/15 0450 03/03/15 1600 03/04/15 0630  WBC 8.1  --  14.9*  --  12.9*  NEUTROABS 5.0  --  12.7*  --  8.2*  HGB 12.6 12.6 12.8 11.4* 11.1*  HCT 39.7 39.1 38.9 34.9* 35.2*  MCV 87.1  --  87.0  --  87.3  PLT 335  --  308  --  245    CBG: No results for input(s): GLUCAP in the last 168 hours.  Active Problems:   Morbid obesity   HTN (hypertension)   Osteoarthritis of both knees   GERD (gastroesophageal reflux disease)   Prediabetes   S/P gastric bypass   Time coordinating discharge: 15 minutes  Signed:  Atilano Ina, MD [facility], [place] [phone] 03/04/2015, 2:56 PM

## 2015-03-04 NOTE — Progress Notes (Signed)
Patient alert and oriented, pain is controlled. Patient is tolerating fluids,  advanced to protein shake today, patient tolerated well. Reviewed Gastric Bypass discharge instructions with patient and patient is able to articulate understanding. Provided information on BELT program, Support Group and [facility] outpatient pharmacy. All questions answered, will continue to monitor.    

## 2015-03-09 ENCOUNTER — Telehealth (HOSPITAL_COMMUNITY): Payer: Self-pay

## 2015-03-09 NOTE — Telephone Encounter (Signed)

## 2015-03-17 ENCOUNTER — Encounter: Payer: Managed Care, Other (non HMO) | Attending: General Surgery

## 2015-03-17 DIAGNOSIS — Z6841 Body Mass Index (BMI) 40.0 and over, adult: Secondary | ICD-10-CM | POA: Insufficient documentation

## 2015-03-17 DIAGNOSIS — Z713 Dietary counseling and surveillance: Secondary | ICD-10-CM | POA: Diagnosis not present

## 2015-03-18 NOTE — Progress Notes (Signed)
Bariatric Class:  Appt start time: 1530 end time:  1630.  2 Week Post-Operative Nutrition Class  Patient was seen on 03/17/2015 for Post-Operative Nutrition education at [facility].   Surgery date: 03/02/2015 Surgery type: RYGB Start weight at [facility]: 459.5 lbs on 08/04/14 Weight today: 424.5 lbs  Weight change: 29.5 lbs  TANITA  BODY COMP RESULTS  01/19/15 03/18/15   BMI (kg/m^2) 70.1 65.5   Fat Mass (lbs) 250 220.0   Fat Free Mass (lbs) 204 204.5   Total Body Water (lbs) 149.5 149.5    The following the learning objectives were met by the patient during this course:  Identifies Phase 3A (Soft, High Proteins) Dietary Goals and will begin from 2 weeks post-operatively to 2 months post-operatively  Identifies appropriate sources of fluids and proteins   States protein recommendations and appropriate sources post-operatively  Identifies the need for appropriate texture modifications, mastication, and bite sizes when consuming solids  Identifies appropriate multivitamin and calcium sources post-operatively  Describes the need for physical activity post-operatively and will follow MD recommendations  States when to call healthcare provider regarding medication questions or post-operative complications  Handouts given during class include:  Phase 3A: Soft, High Protein Diet Handout  Follow-Up Plan: Patient will follow-up at [facility] in 6 weeks for 2 month post-op nutrition visit for diet advancement per MD.

## 2015-04-29 ENCOUNTER — Encounter: Payer: Self-pay | Admitting: Dietician

## 2015-04-29 ENCOUNTER — Encounter: Payer: Managed Care, Other (non HMO) | Attending: General Surgery | Admitting: Dietician

## 2015-04-29 DIAGNOSIS — Z6841 Body Mass Index (BMI) 40.0 and over, adult: Secondary | ICD-10-CM | POA: Insufficient documentation

## 2015-04-29 DIAGNOSIS — Z713 Dietary counseling and surveillance: Secondary | ICD-10-CM | POA: Insufficient documentation

## 2015-04-29 NOTE — Progress Notes (Signed)
  Follow-up visit:  8 Weeks Post-Operative RYGB Surgery  Medical Nutrition Therapy:  Appt start time: 0805 end time:  0840.  Primary concerns today: Post-operative Bariatric Surgery Nutrition Management. Returns with a 44.5 lb weight loss in past 6 weeks. Feeling tired and pushing herself to do exercise. Has a really strong sense of smell. Nothing tastes good which has been a struggle. Able to keep food down but would almost prefer not to eat sometimes. Vomiting after making some beef and vegetable soup (only time she has thrown up). Started taking nausea medication which is helping when she is feeling nauseas.  Had some constipation and called Dr. Andrey CampanileWilson since she went for 1 month without have a bowel movement. It is getting better.   Starting to get hunger back sometimes though will fill up a bite of food.   Surgery date: 03/02/2015 Surgery type[facility]GB [facility] lbs on 08/04/14 Weight today: 380.0  lbs  Weight change: 44.5 lbs Total weight loss: 79.5 lbs  TANITA  BODY COMP RESULTS  01/19/15 03/18/15 04/29/15   BMI (kg/m^2) 70.1 65.5 58.6   Fat Mass (lbs) 250 220.0 205.5   Fat Free Mass (lbs) 204 204.5 174.5   Total Body Water (lbs) 149.5 149.5 128.0    Preferred Learning Style:   No preference indicated   Learning Readiness:   Ready  24-hr recall: B (AM): Premier Protein (30 g) Snk (AM): sometimes might have Malawiturkey stick or laughing cow cheese (0-7 g)  L (PM): 1/4 cup chicken salad or Premier Protein (10-30 g) Snk (PM): sometimes might have Malawiturkey stick or laughing cow cheese (0-7 g)  D (PM): 1 oz grilled chicken or low fat refried beans with Malawiturkey kielbasa or SF jello (7 g) Snk (PM): none  Fluid intake: protein shake, water, powerade zero (over 64 oz) Estimated total protein intake: 60 g+  Medications: none Supplementation: taking and now is taking iron too  Using straws: No Drinking while eating: No Hair loss: No Carbonated beverages: No N/V/D/C:  has some nausea and vomited one time after having beef stew, having constipation and taking Miralax 1-2 x day which is helping Dumping syndrome: No   Recent physical activity:  Swim for 30-60 minutes and aerobics (classes sometimes) and walking (does some exercise each day)  Progress Towards Goal(s):  In progress.  Handouts given during visit include:  Phase 3B High Protein + Non Starchy Vegetables   Nutritional Diagnosis:  [place]-3.3 Overweight/obesity related to past poor dietary habits and physical inactivity as evidenced by patient w/ recent RYGB surgery following dietary guidelines for continued weight loss.    Intervention:  Nutrition education/diet advancment. Goals:  Follow Phase 3B: High Protein + Non-Starchy Vegetables  Eat 3-6 small meals/snacks, every 3-5 hrs  Increase lean protein foods to meet 60g goal  Increase fluid intake to 64oz +  Avoid drinking 15 minutes before, during and 30 minutes after eating  Aim for >30 min of physical activity daily   Teaching Method Utilized:  Visual Auditory Hands on  Barriers to learning/adherence to lifestyle change: none  Demonstrated degree of understanding via:  Teach Back   Monitoring/Evaluation:  Dietary intake, exercise, and body weight. Follow up in 1 months for 3 month post-op visit.

## 2015-04-29 NOTE — Patient Instructions (Addendum)
Goals:  Follow Phase 3B: High Protein + Non-Starchy Vegetables  Eat 3-6 small meals/snacks, every 3-5 hrs  Increase lean protein foods to meet 60g goal  Increase fluid intake to 64oz +  Avoid drinking 15 minutes before, during and 30 minutes after eating  Aim for >30 min of physical activity daily  Surgery date: 03/02/2015 Surgery type: RYGB [facility]: 459.5 lbs on 08/04/14 Weight today: 380.0 lbs  Weight change: 44.5 lbs Total weight loss: 79.5 lbs

## 2015-05-28 ENCOUNTER — Encounter: Payer: Managed Care, Other (non HMO) | Attending: General Surgery | Admitting: Dietician

## 2015-05-28 ENCOUNTER — Encounter: Payer: Self-pay | Admitting: Dietician

## 2015-05-28 DIAGNOSIS — Z6841 Body Mass Index (BMI) 40.0 and over, adult: Secondary | ICD-10-CM | POA: Diagnosis not present

## 2015-05-28 DIAGNOSIS — Z713 Dietary counseling and surveillance: Secondary | ICD-10-CM | POA: Diagnosis not present

## 2015-05-28 NOTE — Progress Notes (Signed)
  Follow-up visit:  12 Weeks Post-Operative RYGB Surgery  Medical Nutrition Therapy:  Appt start time: 0845 end time:  0905  Primary concerns today: Post-operative Bariatric Surgery Nutrition Management. Returns with a 14 lb weight loss in past 4 weeks. Has been having work stress. No longer using a seatbelt extension! Doing some extra walking while shopping and took the steps at work recently. Has a cold and has not been able to work out like she was. Vegetables have helped with constipation some but still taking Miralax. Found that she can tolerate scallops and shrimp.   Surgery date: 03/02/2015 Surgery type: [facility]YGB [facility] on 08/04/14 Weight today: 366.0 Lbs  Weight change: 14 lbs Total weight loss: 93.5 lbs  TANITA  BODY COMP RESULTS  01/19/15 03/18/15 04/29/15 05/28/15   BMI (kg/m^2) 70.1 65.5 58.6 52.2   Fat Mass (lbs) 250 220.0 205.5 191   Fat Free Mass (lbs) 204 204.5 174.5 175.0   Total Body Water (lbs) 149.5 149.5 128.0 128.0    Preferred Learning Style:   No preference indicated   Learning Readiness:   Ready  24-hr recall: B (AM): Premier Protein or Atkins (15-30 g) Snk (AM): sometimes might have Malawiturkey stick or egg (0-7 g)  L (PM): cherry tomatoes, cucumber, other veggies  Snk (PM): sometimes might have Malawiturkey stick or egg  (0-7 g)  D (PM): 1 oz grilled chicken or low fat refried beans with Malawiturkey Perukielbasa sometimes with vegetables(7 g) Snk (PM): none  Having 2-3 protein shakes per day (45-60 g)  Fluid intake: protein shake, water, powerade zero (over 64 oz) Estimated total protein intake: 60 g+  Medications: none Supplementation: taking and now is taking iron too  Using straws: No Drinking while eating: No Hair loss: No Carbonated beverages: No N/V/D/C: has some nausea that is getting better, having constipation and taking Miralax 1-2 x day which is helping Dumping syndrome: No   Recent physical activity:  Swim for 30-60 minutes  and aerobics (classes sometimes) and walking (does some exercise each day)  Progress Towards Goal(s):  In progress.  Handouts given during visit include:  none   Nutritional Diagnosis:  [place]-3.3 Overweight/obesity related to past poor dietary habits and physical inactivity as evidenced by patient w/ recent RYGB surgery following dietary guidelines for continued weight loss.    Intervention:  Nutrition education/diet advancment. Goals:  Follow Phase 3B: High Protein + Non-Starchy Vegetables  Eat 3-6 small meals/snacks, every 3-5 hrs  Increase lean protein foods to meet 60g goal  Increase fluid intake to 64oz +  Avoid drinking 15 minutes before, during and 30 minutes after eating  Aim for >30 min of physical activity daily   Teaching Method Utilized:  Visual Auditory Hands on  Barriers to learning/adherence to lifestyle change: none  Demonstrated degree of understanding via:  Teach Back   Monitoring/Evaluation:  Dietary intake, exercise, and body weight. Follow up in 3 months for 6 month post-op visit.

## 2015-05-28 NOTE — Patient Instructions (Addendum)
Goals:  Follow Phase 3B: High Protein + Non-Starchy Vegetables  Eat 3-6 small meals/snacks, every 3-5 hrs  Increase lean protein foods to meet 60g goal  When possible, work on eating more protein foods and having less shakes (egg white omelette with spinach)  Increase fluid intake to 64oz +  Avoid drinking 15 minutes before, during and 30 minutes after eating  Aim for >30 min of physical activity daily  Surgery date: 03/02/2015 Surgery type: RYGB [facility] lbs on 08/04/14 Weight today: 366.0 Lbs  Weight change: 14 lbs Total weight loss: 93.5 lbs  TANITA  BODY COMP RESULTS  01/19/15 03/18/15 04/29/15 05/28/15   BMI (kg/m^2) 70.1 65.5 58.6 52.2   Fat Mass (lbs) 250 220.0 205.5 191   Fat Free Mass (lbs) 204 204.5 174.5 175.0   Total Body Water (lbs) 149.5 149.5 128.0 128.0

## 2015-09-10 ENCOUNTER — Encounter: Payer: Managed Care, Other (non HMO) | Attending: General Surgery | Admitting: Dietician

## 2015-09-10 ENCOUNTER — Encounter: Payer: Self-pay | Admitting: Dietician

## 2015-09-10 DIAGNOSIS — Z029 Encounter for administrative examinations, unspecified: Secondary | ICD-10-CM | POA: Insufficient documentation

## 2015-09-10 NOTE — Progress Notes (Signed)
  Follow-up visit:  6 Months Post-Operative RYGB Surgery  Medical Nutrition Therapy:  Appt start time: 500 end time:  515  Primary concerns today: Post-operative Bariatric Surgery Nutrition Management. Returns with a 46.5 lb weight loss in past 3 months. Wants to sign up for a 3 mile [employer] charity walk. Down to one medication, sleeps great, and doesn't breath hard anymore. Has a lot of energy and is about halfway to her goal. Has lost 140 lbs!  Eats a lot of lean ground meat.    Surgery date: 03/02/2015 Surgery type: RYGB [facility] lbs on 08/04/14 Weight today: 319.5 Lbs  Weight change: 46.5 lbs Total weight loss: 140 lbs  TANITA  BODY COMP RESULTS  01/19/15 03/18/15 04/29/15 05/28/15 09/10/15   BMI (kg/m^2) 70.1 65.5 58.6 52.2 49.3   Fat Mass (lbs) 250 220.0 205.5 191 146.0   Fat Free Mass (lbs) 204 204.5 174.5 175.0 173.5   Total Body Water (lbs) 149.5 149.5 128.0 128.0 127.0    Preferred Learning Style:   No preference indicated   Learning Readiness:   Ready  24-hr recall: B (AM): Premier Protein  (30 g) Snk (AM): sometimes might have Malawiturkey stick or egg or tuna pouch (0-15 g)  L (PM): cherry tomatoes, cucumber, other veggies with tuna (15g)  Snk (PM): sometimes might have Malawiturkey stick or egg or tuna pouch (0-15 g)  D (PM): 2 oz grilled chicken or low fat refried beans with Malawiturkey Perukielbasa sometimes with vegetables or tuna pouch(15g) Snk (PM): none  Fluid intake: protein shake, water, powerade zero (over 100 oz) Estimated total protein intake: 60 g+  Medications: none Supplementation: taking and now is taking iron too  Using straws: No Drinking while eating: No Hair loss: getting better Carbonated beverages: tried one and it "hurt bad" N/V/D/C:  constipation is better with more veggies and exercising Dumping syndrome: No   Recent physical activity:  15-20 minutes on elliptical machine/treadmill, walks, bike, swim for 90 minutes and  aerobics (classes sometimes) (goes the gym 3-5 x per week)  Progress Towards Goal(s):  In progress.  Handouts given during visit include:  none   Nutritional Diagnosis:  [place]-3.3 Overweight/obesity related to past poor dietary habits and physical inactivity as evidenced by patient w/ recent RYGB surgery following dietary guidelines for continued weight loss.    Intervention:  Nutrition education/diet reinforcment Goals:  Follow Phase 3B: High Protein + Non-Starchy Vegetables  Eat 3-6 small meals/snacks, every 3-5 hrs  Increase lean protein foods to meet 60g goal  Increase fluid intake to 64oz +  Avoid drinking 15 minutes before, during and 30 minutes after eating  Aim for >30 min of physical activity daily   Teaching Method Utilized:  Visual Auditory Hands on  Barriers to learning/adherence to lifestyle change: none  Demonstrated degree of understanding via:  Teach Back   Monitoring/Evaluation:  Dietary intake, exercise, and body weight. Follow up in 3 months for 9 month post-op visit.

## 2015-09-10 NOTE — Patient Instructions (Signed)
Goals:  Follow Phase 3B: High Protein + Non-Starchy Vegetables  Eat 3-6 small meals/snacks, every 3-5 hrs  Increase lean protein foods to meet 60g goal  When possible, work on eating more protein foods and having less shakes (egg white omelette with spinach)  Increase fluid intake to 64oz +  Avoid drinking 15 minutes before, during and 30 minutes after eating  Aim for >30 min of physical activity daily  Surgery date: 03/02/2015 Surgery type: RYGB [facility] on 08/04/14 Weight today: 319.5 Lbs  Weight change: 46.5 lbs Total weight loss: 140 lbs  TANITA  BODY COMP RESULTS  01/19/15 03/18/15 04/29/15 05/28/15 09/10/15   BMI (kg/m^2) 70.1 65.5 58.6 52.2 49.3   Fat Mass (lbs) 250 220.0 205.5 191 146.0   Fat Free Mass (lbs) 204 204.5 174.5 175.0 173.5   Total Body Water (lbs) 149.5 149.5 128.0 128.0 127.0

## 2015-12-14 ENCOUNTER — Ambulatory Visit: Payer: Self-pay | Admitting: Dietician

## 2016-03-28 ENCOUNTER — Encounter: Payer: Managed Care, Other (non HMO) | Attending: General Surgery | Admitting: Dietician

## 2016-03-28 DIAGNOSIS — Z9884 Bariatric surgery status: Secondary | ICD-10-CM | POA: Insufficient documentation

## 2016-03-28 DIAGNOSIS — Z713 Dietary counseling and surveillance: Secondary | ICD-10-CM | POA: Insufficient documentation

## 2016-03-28 NOTE — Patient Instructions (Addendum)
Goals:  Follow Phase 3B: High Protein + Non-Starchy Vegetables  Eat 3-6 small meals/snacks, every 3-5 hrs  Increase lean protein foods to meet 60g goal  Increase fluid intake to 64oz +  Avoid drinking 15 minutes before, during and 30 minutes after eating  Aim for >30 min of physical activity daily  Bariatric Advantage Ultra Multi with Iron - 3 per day ($51.95 for a 90 day supply).  Celebrate Multi-Complete 36/45/60 with Iron - 3 per day  ($49.95 for 90 day supply) the 18 mg version  does not have enough thiamin   Surgery date: 03/02/2015 Surgery type: RYGB [facility] lbs on 08/04/14 Weight today: 269.0 Lbs  Weight change: 50.5 lbs Total weight loss: 190.5 lbs  TANITA  BODY COMP RESULTS  01/19/15 03/18/15 04/29/15 05/28/15 09/10/15 03/28/16   BMI (kg/m^2) 70.1 65.5 58.6 52.2 49.3 41.5   Fat Mass (lbs) 250 220.0 205.5 191 146.0 115.4   Fat Free Mass (lbs) 204 204.5 174.5 175.0 173.5 153.6   Total Body Water (lbs) 149.5 149.5 128.0 128.0 127.0 113.2

## 2016-03-28 NOTE — Progress Notes (Signed)
  Follow-up visit:  1 Year Post-Operative RYGB Surgery  Medical Nutrition Therapy:  Appt start time: 240 end time:  300  Primary concerns today: Post-operative Bariatric Surgery Nutrition Management. Returns with a 50.5 lb weight loss in past 6 months.   She can eat chicken again. Stays away from most carbs. Eating less cheese than she used to.   Has been feeling tired lately. Will be seeing Dr. Andrey CampanileWilson. 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   Surgery date: 03/02/2015 Surgery type: RYGB [facility] lbs on 08/04/14 Weight today: 269.0 Lbs  Weight change: 50.5 lbs Total weight loss: 190.5 lbs  TANITA  BODY COMP RESULTS  01/19/15 03/18/15 04/29/15 05/28/15 09/10/15 03/28/16   BMI (kg/m^2) 70.1 65.5 58.6 52.2 49.3 41.5   Fat Mass (lbs) 250 220.0 205.5 191 146.0 115.4   Fat Free Mass (lbs) 204 204.5 174.5 175.0 173.5 153.6   Total Body Water (lbs) 149.5 149.5 128.0 128.0 127.0 113.2    Preferred Learning Style:   No preference indicated   Learning Readiness:   Ready  24-hr recall: B (AM): egg rarely or Premier Protein  (30 g) Snk (AM): sometimes might have Malawiturkey stick or egg or tuna pouch (0-15 g)  L (PM): cherry tomatoes, cucumber, other veggies with tuna (15g)  Snk (PM): sometimes might have Malawiturkey stick or egg or tuna pouch (0-15 g)  D (PM): 2 oz grilled chicken or low fat refried beans with Malawiturkey Perukielbasa sometimes with vegetables or tuna pouch(15g) Snk (PM): none  Fluid intake: protein shake, water, powerade zero, coffee sometimes (drinking less at home) Estimated total protein intake: 60-70 g  Medications: none Supplementation: taking and now is taking iron too  Using straws: No Drinking while eating: No Hair loss: getting better Carbonated beverages: tried one and it "hurt bad" N/V/D/C:  constipation is better with more veggies and exercising Dumping syndrome: No   Recent physical activity:  15-20 minutes on elliptical machine/treadmill, walks, bike, swim for 90  minutes and aerobics (classes sometimes) (goes the gym 4 x per week)  Progress Towards Goal(s):  In progress.  Handouts given during visit include:  none   Nutritional Diagnosis:  [place]-3.3 Overweight/obesity related to past poor dietary habits and physical inactivity as evidenced by patient w/ recent RYGB surgery following dietary guidelines for continued weight loss.    Intervention:  Nutrition education/diet reinforcment Goals:  Follow Phase 3B: High Protein + Non-Starchy Vegetables  Eat 3-6 small meals/snacks, every 3-5 hrs  Increase lean protein foods to meet 60g goal  Increase fluid intake to 64oz +  Avoid drinking 15 minutes before, during and 30 minutes after eating  Aim for >30 min of physical activity daily  Bariatric Advantage Ultra Multi with Iron - 3 per day ($51.95 for a 90 day supply).  Celebrate Multi-Complete 36/45/60 with Iron - 3 per day  ($49.95 for 90 day supply) the 18 mg version  does not have enough thiamin    Teaching Method Utilized:  Visual Auditory Hands on  Barriers to learning/adherence to lifestyle change: none  Demonstrated degree of understanding via:  Teach Back   Monitoring/Evaluation:  Dietary intake, exercise, and body weight. Follow up prn

## 2016-06-18 IMAGING — DX DG CHEST 2V
2 series · 2 of 2 positions shown · non-contrast
Comparison: None.

CLINICAL DATA: Morbid obesity. Pre-op evaluation for bariatric
surgery. Gastroesophageal reflux disease.

EXAM:
CHEST  2 VIEW

[chest pa]
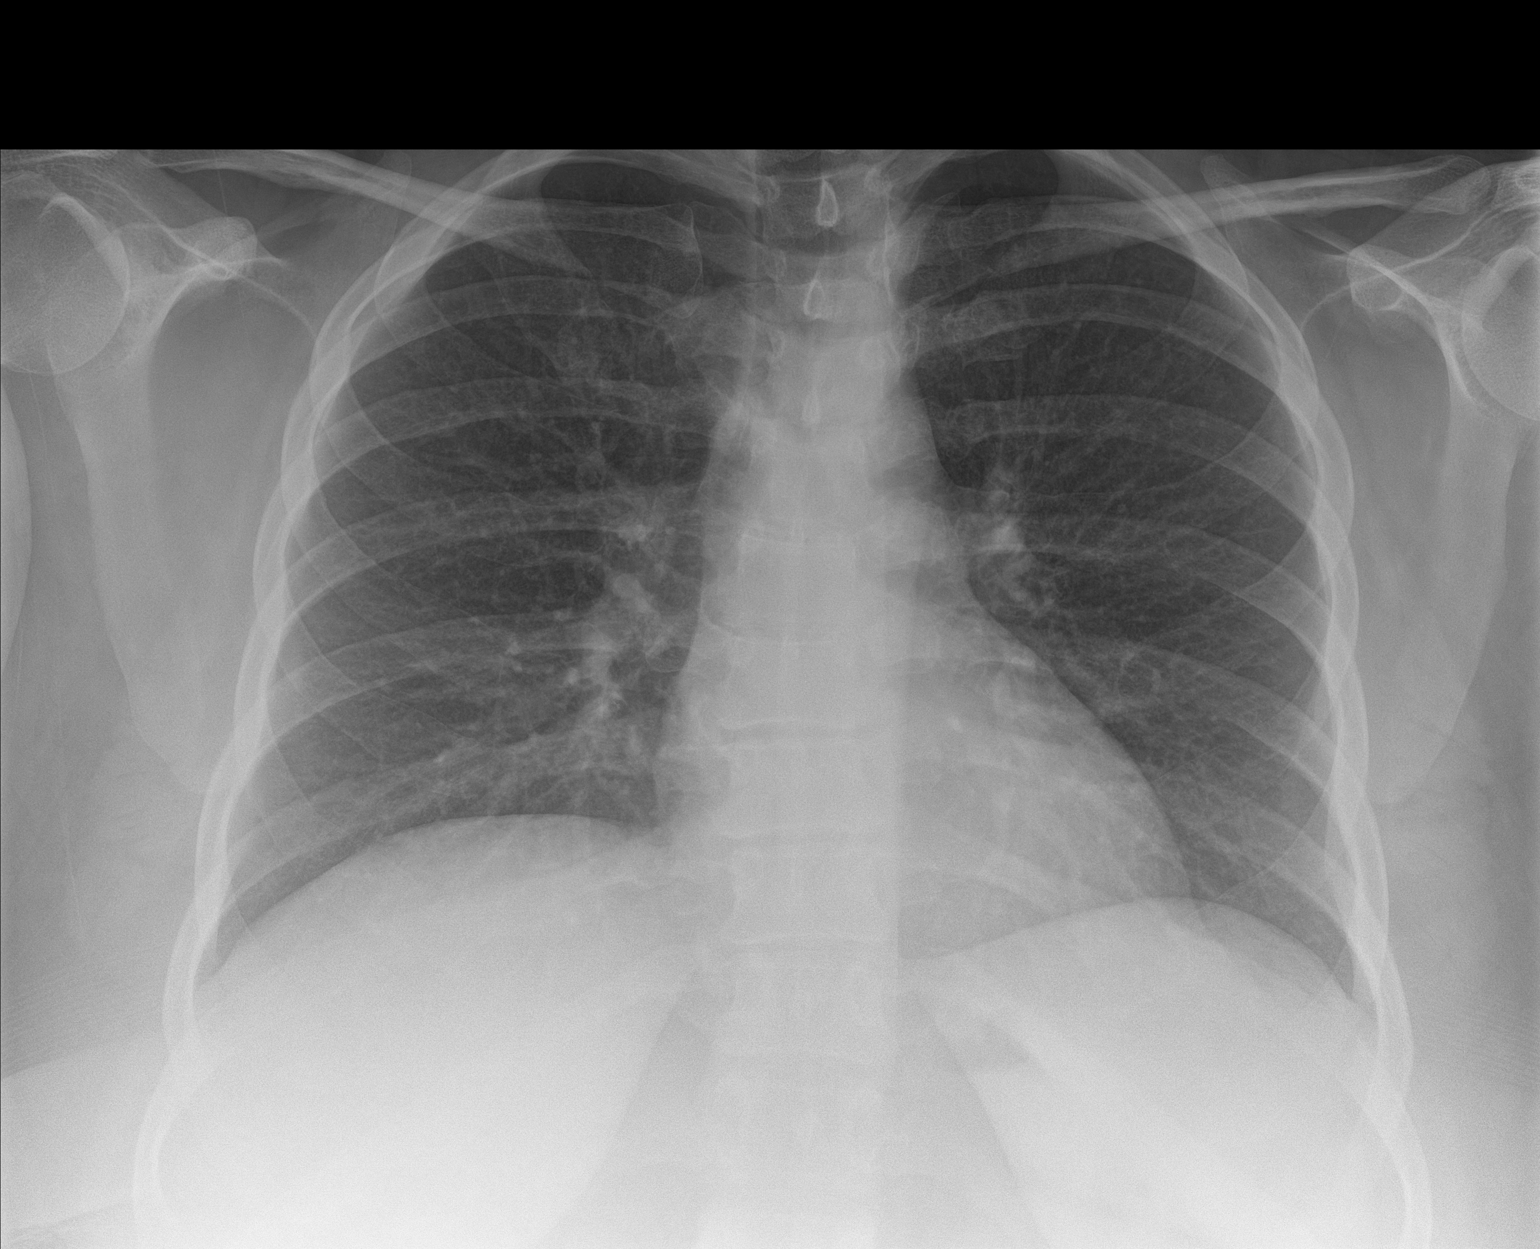

[chest lat]
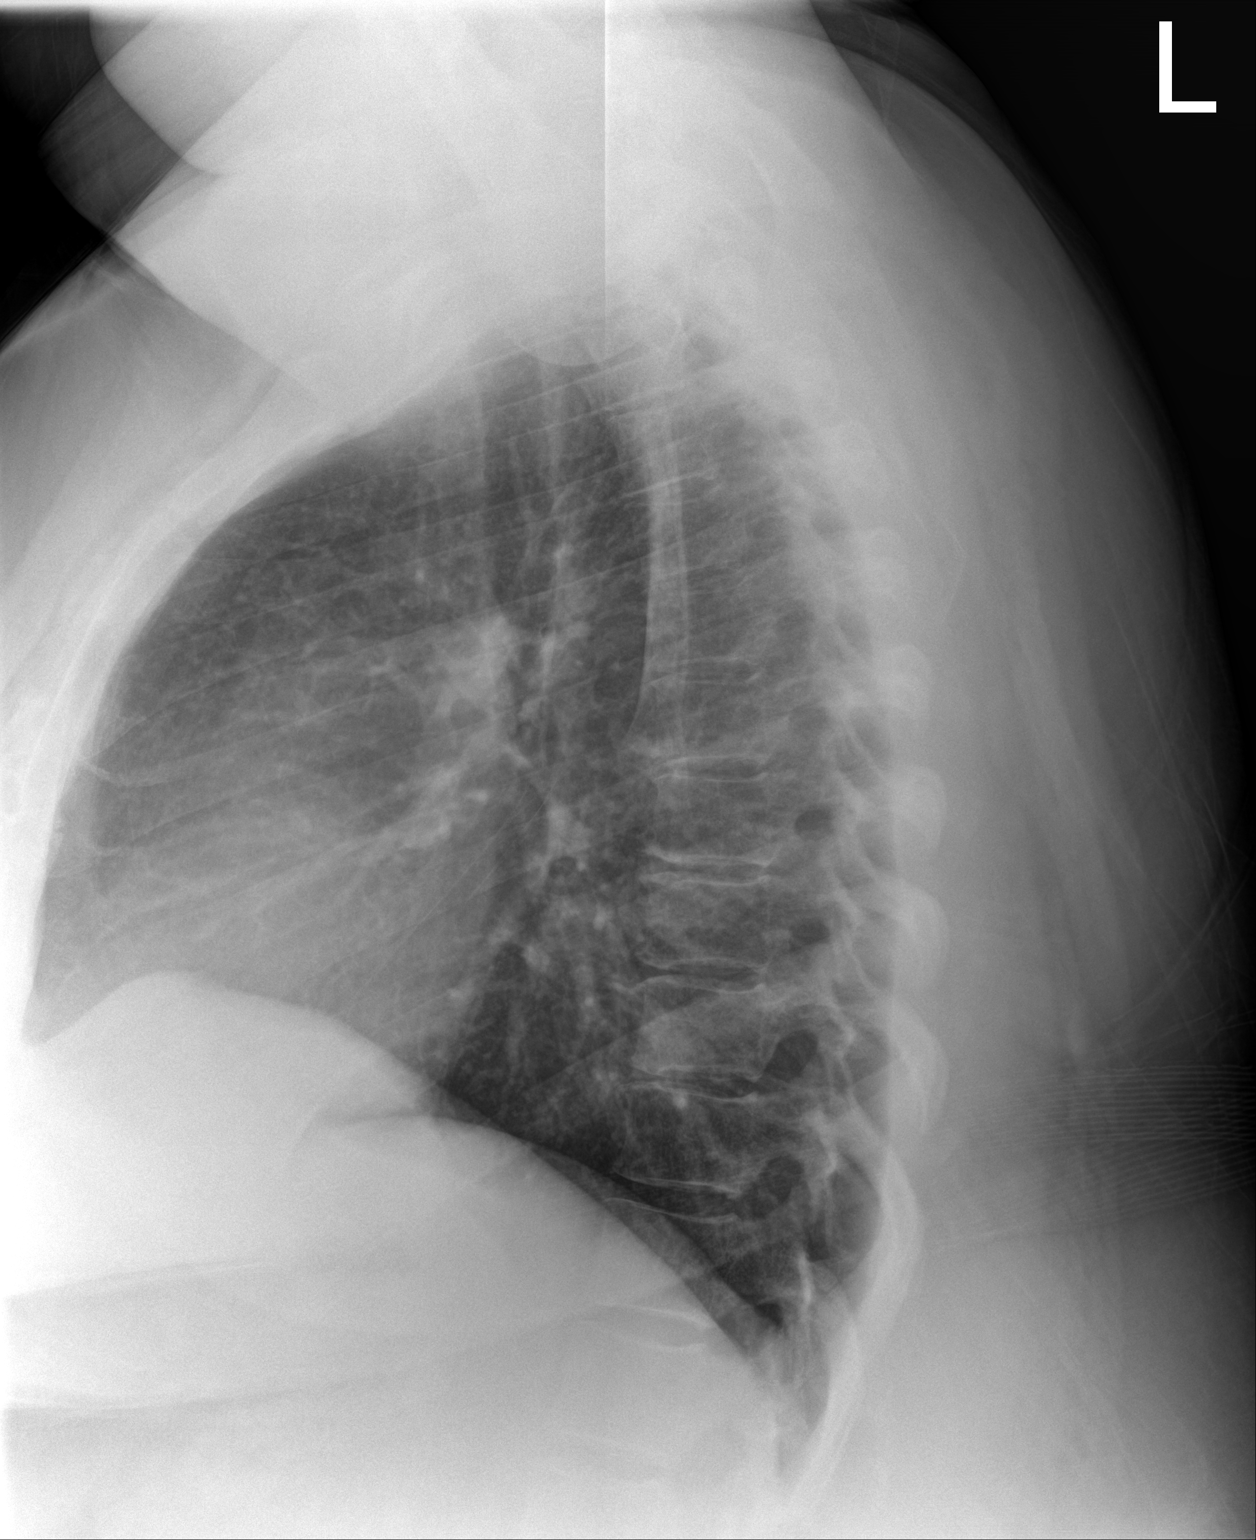

[2 of 2 positions shown; findings below may reference images not displayed]

FINDINGS: The heart size and mediastinal contours are within normal limits.
Both lungs are clear. The visualized skeletal structures are
unremarkable.
IMPRESSION: No active cardiopulmonary disease.
